# Patient Record
Sex: Male | Born: 1985 | Race: Black or African American | Hispanic: No | Marital: Married | Smoking: Never smoker
Health system: Southern US, Community
[De-identification: ages and names within clinical notes are randomized; demographics above are authoritative.]

## PROBLEM LIST (undated history)

## (undated) DIAGNOSIS — D721 Eosinophilia, unspecified: Secondary | ICD-10-CM

## (undated) DIAGNOSIS — B20 Human immunodeficiency virus [HIV] disease: Secondary | ICD-10-CM

## (undated) DIAGNOSIS — G43909 Migraine, unspecified, not intractable, without status migrainosus: Secondary | ICD-10-CM

## (undated) DIAGNOSIS — B019 Varicella without complication: Secondary | ICD-10-CM

## (undated) DIAGNOSIS — Z21 Asymptomatic human immunodeficiency virus [HIV] infection status: Secondary | ICD-10-CM

## (undated) DIAGNOSIS — D72819 Decreased white blood cell count, unspecified: Secondary | ICD-10-CM

## (undated) HISTORY — DX: Eosinophilia, unspecified: D72.10

## (undated) HISTORY — DX: Migraine, unspecified, not intractable, without status migrainosus: G43.909

## (undated) HISTORY — DX: Human immunodeficiency virus (HIV) disease: B20

## (undated) HISTORY — DX: Asymptomatic human immunodeficiency virus (hiv) infection status: Z21

## (undated) HISTORY — DX: Varicella without complication: B01.9

## (undated) HISTORY — DX: Decreased white blood cell count, unspecified: D72.819

---

## 2017-03-24 HISTORY — PX: TONSILLECTOMY: SHX5217

## 2017-12-20 ENCOUNTER — Ambulatory Visit: Payer: BLUE CROSS/BLUE SHIELD | Admitting: Physician Assistant

## 2017-12-20 ENCOUNTER — Other Ambulatory Visit: Payer: Self-pay

## 2017-12-20 ENCOUNTER — Encounter: Payer: Self-pay | Admitting: Physician Assistant

## 2017-12-20 VITALS — BP 122/76 | HR 79 | Temp 98.2°F | Resp 18 | Ht 72.05 in | Wt 195.2 lb

## 2017-12-20 DIAGNOSIS — R05 Cough: Secondary | ICD-10-CM

## 2017-12-20 DIAGNOSIS — J069 Acute upper respiratory infection, unspecified: Secondary | ICD-10-CM | POA: Diagnosis not present

## 2017-12-20 DIAGNOSIS — J029 Acute pharyngitis, unspecified: Secondary | ICD-10-CM

## 2017-12-20 DIAGNOSIS — J3489 Other specified disorders of nose and nasal sinuses: Secondary | ICD-10-CM | POA: Diagnosis not present

## 2017-12-20 DIAGNOSIS — R059 Cough, unspecified: Secondary | ICD-10-CM

## 2017-12-20 MED ORDER — IPRATROPIUM BROMIDE 0.03 % NA SOLN: 2.0000 | Freq: Two times a day (BID) | NASAL | 0 refills | Status: DC

## 2017-12-20 MED ORDER — PSEUDOEPHEDRINE HCL 60 MG PO TABS: 60.0000 mg | ORAL_TABLET | Freq: Two times a day (BID) | ORAL | 0 refills | Status: DC

## 2017-12-20 MED ORDER — HYDROCODONE-HOMATROPINE 5-1.5 MG/5ML PO SYRP: 5.0000 mL | ORAL_SOLUTION | Freq: Three times a day (TID) | ORAL | 0 refills | Status: DC | PRN

## 2017-12-20 MED ORDER — BENZONATATE 100 MG PO CAPS: 100.0000 mg | ORAL_CAPSULE | Freq: Three times a day (TID) | ORAL | 0 refills | Status: DC | PRN

## 2017-12-20 NOTE — Patient Instructions (Addendum)
- We will treat this as a respiratory viral infection.  - I recommend you rest, drink plenty of fluids, eat light meals including soups.  - You may use cough syrup at night for your cough and sore throat, Tessalon pearls during the day to stop the cough. If you want to bring up the mucus, take mucinex. Be aware that cough syrup can definitely make you drowsy and sleepy so do not drive or operate any heavy machinery if it is affecting you during the day.  - You may use sudafed for sinus pressure and nasal congestion. You can also use nasal spray for congestion. You can also use over the counter nasal saline rinses to help with nasal congestion.  - You may also use Tylenol or ibuprofen over-the-counter for your sore throat.  - Please let me know if you are not seeing any improvement or get worse in 5-7 days.     Upper Respiratory Infection, Adult Most upper respiratory infections (URIs) are caused by a virus. A URI affects the nose, throat, and upper air passages. The most common type of URI is often called "the common cold." Follow these instructions at home:  Take medicines only as told by your doctor.  Gargle warm saltwater or take cough drops to comfort your throat as told by your doctor.  Use a warm mist humidifier or inhale steam from a shower to increase air moisture. This may make it easier to breathe.  Drink enough fluid to keep your pee (urine) clear or pale yellow.  Eat soups and other clear broths.  Have a healthy diet.  Rest as needed.  Go back to work when your fever is gone or your doctor says it is okay. ? You may need to stay home longer to avoid giving your URI to others. ? You can also wear a face mask and wash your hands often to prevent spread of the virus.  Use your inhaler more if you have asthma.  Do not use any tobacco products, including cigarettes, chewing tobacco, or electronic cigarettes. If you need help quitting, ask your doctor. Contact a doctor  if:  You are getting worse, not better.  Your symptoms are not helped by medicine.  You have chills.  You are getting more short of breath.  You have brown or red mucus.  You have yellow or brown discharge from your nose.  You have pain in your face, especially when you bend forward.  You have a fever.  You have puffy (swollen) neck glands.  You have pain while swallowing.  You have white areas in the back of your throat. Get help right away if:  You have very bad or constant: ? Headache. ? Ear pain. ? Pain in your forehead, behind your eyes, and over your cheekbones (sinus pain). ? Chest pain.  You have long-lasting (chronic) lung disease and any of the following: ? Wheezing. ? Long-lasting cough. ? Coughing up blood. ? A change in your usual mucus.  You have a stiff neck.  You have changes in your: ? Vision. ? Hearing. ? Thinking. ? Mood. This information is not intended to replace advice given to you by your health care provider. Make sure you discuss any questions you have with your health care provider. Document Released: 03/28/2008 Document Revised: 06/12/2016 Document Reviewed: 01/15/2014 Elsevier Interactive Patient Education  2018 [employer].   IF you received an x-ray today, you will receive an invoice from [facility]. Please contact [facility] at  [phone] with questions or concerns regarding your invoice.   IF you received labwork today, you will receive an invoice from [facility]. Please contact [facility] at [phone] with questions or concerns regarding your invoice.   Our billing staff will not be able to assist you with questions regarding bills from these companies.  You will be contacted with the lab results as soon as they are available. The fastest way to get your results is to activate your My Chart account. Instructions are located on the last page of this paperwork. If you have not heard from us regarding the  results in 2 weeks, please contact this office.

## 2017-12-20 NOTE — Progress Notes (Signed)
MRN: [id number] DOB: 04-07-1986  Subjective:   Joel Wright is a 32 y.o. male presenting for chief complaint of Cough (X 3 days- pt states with yellow mucus) .  Reports 3 day history of productive cough, nasal congestion, sinus pressure,  and sore throat. Symptoms are improving. Has tried mucinex with some relief. Denies fever, ear pain, wheezing, shortness of breath and chest pain, nausea, vomiting, abdominal pain and diarrhea. Has not had sick contact with anyone. No history of seasonal allergies, no history of asthma. Patient has not flu shot this season.Denies Denies any other aggravating or relieving factors, no other questions or concerns.  [name] currently has no medications in their medication list. Also has No Known Allergies.  [name]  has no past medical history on file. Also  has a past surgical history that includes Tonsillectomy (03/2017).   Objective:   Vitals: BP 122/76 (BP Location: Left Arm, Patient Position: Sitting, Cuff Size: Normal)   Pulse 79   Temp 98.2 F (36.8 C) (Oral)   Resp 18   Ht 6' 0.05" (1.83 m)   Wt 195 lb 3.2 oz (88.5 kg)   SpO2 97%   BMI 26.44 kg/m   Physical Exam  Constitutional: He is oriented to person, place, and time. He appears well-developed and well-nourished.  HENT:  Head: Normocephalic and atraumatic.  Right Ear: Tympanic membrane, external ear and ear canal normal.  Left Ear: Tympanic membrane, external ear and ear canal normal.  Nose: Mucosal edema and rhinorrhea present. Right sinus exhibits maxillary sinus tenderness (mild). Right sinus exhibits no frontal sinus tenderness. Left sinus exhibits maxillary sinus tenderness (mild). Left sinus exhibits no frontal sinus tenderness.  Mouth/Throat: Uvula is midline, oropharynx is clear and moist and mucous membranes are normal. Tonsils are 1+ on the right. Tonsils are 1+ on the left. No tonsillar exudate.  Eyes: Conjunctivae are normal. Pupils are equal, round, and reactive to light.    Neck: Normal range of motion.  Cardiovascular: Normal rate, regular rhythm and normal heart sounds.  Pulmonary/Chest: Effort normal. He has no decreased breath sounds. He has no wheezes. He has no rhonchi. He has no rales.  Lymphadenopathy:       Head (right side): No submental, no submandibular, no tonsillar, no preauricular, no posterior auricular and no occipital adenopathy present.       Head (left side): No submental, no submandibular, no tonsillar, no preauricular, no posterior auricular and no occipital adenopathy present.    He has cervical adenopathy.       Right cervical: Posterior cervical adenopathy present. No superficial cervical and no deep cervical adenopathy present.      Left cervical: Posterior cervical adenopathy present. No superficial cervical and no deep cervical adenopathy present.       Right: No supraclavicular adenopathy present.       Left: No supraclavicular adenopathy present.  Neurological: He is alert and oriented to person, place, and time.  Skin: Skin is warm and dry.  Psychiatric: He has a normal mood and affect.  Vitals reviewed.   No results found for this or any previous visit (from the past 24 hour(s)).  Assessment and Plan :  1. Sinus pressure - pseudoephedrine (SUDAFED) 60 MG tablet; Take 1 tablet (60 mg total) by mouth every 12 (twelve) hours.  Dispense: 30 tablet; Refill: 0 2. Sore throat 3. Rhinorrhea - ipratropium (ATROVENT) 0.03 % nasal spray; Place 2 sprays into both nostrils 2 (two) times daily.  Dispense: 30 mL;  Refill: 0 4. Cough - benzonatate (TESSALON) 100 MG capsule; Take 1-2 capsules (100-200 mg total) by mouth 3 (three) times daily as needed for cough.  Dispense: 40 capsule; Refill: 0 - HYDROcodone-homatropine (HYCODAN) 5-1.5 MG/5ML syrup; Take 5 mLs by mouth every 8 (eight) hours as needed for cough.  Dispense: 120 mL; Refill: 0 5. Acute upper respiratory infection Hx and PE findings consistent with viral URI.  Recommend  symptomatic treatment at this time.  Patient is a well-appearing, no distress. Vitals stable.  Lungs CTAB. Advised to return to clinic if symptoms worsen, do not improve in 5-7 days, or as needed.   Benjiman Core, PA-C  Primary Care at [facility] 12/20/2017 1:19 PM

## 2017-12-21 ENCOUNTER — Other Ambulatory Visit: Payer: Self-pay

## 2017-12-21 ENCOUNTER — Other Ambulatory Visit: Payer: Self-pay | Admitting: Physician Assistant

## 2017-12-21 DIAGNOSIS — R05 Cough: Secondary | ICD-10-CM

## 2017-12-21 DIAGNOSIS — R059 Cough, unspecified: Secondary | ICD-10-CM

## 2017-12-21 MED ORDER — HYDROCODONE-HOMATROPINE 5-1.5 MG/5ML PO SYRP: 5.0000 mL | ORAL_SOLUTION | Freq: Three times a day (TID) | ORAL | 0 refills | Status: DC | PRN

## 2017-12-21 NOTE — Telephone Encounter (Signed)
Copied from CRM [phone]. Topic: Quick Communication - Rx Refill/Question >> Dec 21, 2017  4:00 PM [name], [name], [name] wrote: Medication: hycodan syrup 5-1.5 mg/ml   Has the patient contacted their pharmacy? yes   (Agent: If no, request that the patient contact the pharmacy for the refill.)   Preferred Pharmacy (with phone number [facility] [address] [facility] [address] [facility] did not [facility] it yesterday and this on does   Agent: Please be advised that RX refills may take up to 3 business days. We ask that you follow-up with your pharmacy.   Original pharmacy did not have medication

## 2017-12-22 ENCOUNTER — Other Ambulatory Visit: Payer: Self-pay | Admitting: Physician Assistant

## 2017-12-22 DIAGNOSIS — R05 Cough: Secondary | ICD-10-CM

## 2017-12-22 DIAGNOSIS — R059 Cough, unspecified: Secondary | ICD-10-CM

## 2017-12-22 MED ORDER — HYDROCODONE-HOMATROPINE 5-1.5 MG/5ML PO SYRP
5.0000 mL | ORAL_SOLUTION | Freq: Three times a day (TID) | ORAL | 0 refills | Status: DC | PRN
Start: 2017-12-22 — End: 2018-01-12

## 2017-12-22 NOTE — Telephone Encounter (Signed)
Copied from CRM [id number]. Topic: Quick Communication - Rx Refill/Question >> Dec 21, 2017  4:00 PM [name], Tashia, [name] wrote: Medication: hycodan syrup 5-1.5 mg/ml   Has the patient contacted their pharmacy? yes   (Agent: If no, request that the patient contact the pharmacy for the refill.)   Preferred Pharmacy (with phone number [facility] [address] [facility] [address] [facility] did not have it yesterday and this on does   Agent: Please be advised that RX refills may take up to 3 business days. We ask that you follow-up with your pharmacy. >> Dec 22, 2017 11:53 AM Raquel SarnaHayes, Teresa G wrote: Pt is now [facility] the [facility] [address] [place].  The original order was to [facility] filled at [facility] [address] They were out at the time of the Rx request.  The pt was seen by [place]Brittany Wiseman on Wed and hasn't had his Rx filled and he is still sick needing this called in today if possible.

## 2017-12-22 NOTE — Telephone Encounter (Signed)
Spoke to pt who clarified that initial Rx was electronically sent but pharmacy did not have medication.  (Confirmed with pharmacy that Rx has not been filled.)  CMA was resending but appears that Rx was printed instead.  Unable to find paper copy but Rx was not signed out by patient.  Spoke to CitigroupWhitney McVey, [place], who is Aeronautical engineerresubmitting electronic Rx as previous PA is not here.  Pt advised.

## 2017-12-22 NOTE — Telephone Encounter (Signed)
Spoke to clinical in the office and verified that rx was printed.  [facility]

## 2017-12-22 NOTE — Telephone Encounter (Signed)
Pt calling back and requesting for rx for Hycodan syrup 5/1.5mg /ml [facility] [address] [address] on 2/28.

## 2017-12-22 NOTE — Telephone Encounter (Signed)
Pt received call, no documentation - pt is asking if he can pick up the rx that was printed?  He is needing the medication soon.   Please call [phone]

## 2017-12-27 ENCOUNTER — Ambulatory Visit: Payer: BLUE CROSS/BLUE SHIELD | Admitting: Urgent Care

## 2017-12-27 ENCOUNTER — Encounter: Payer: Self-pay | Admitting: Urgent Care

## 2017-12-27 VITALS — BP 131/83 | HR 99 | Temp 98.6°F | Resp 16 | Ht 72.05 in | Wt 195.0 lb

## 2017-12-27 DIAGNOSIS — R05 Cough: Secondary | ICD-10-CM

## 2017-12-27 DIAGNOSIS — J3489 Other specified disorders of nose and nasal sinuses: Secondary | ICD-10-CM | POA: Diagnosis not present

## 2017-12-27 DIAGNOSIS — J069 Acute upper respiratory infection, unspecified: Secondary | ICD-10-CM | POA: Diagnosis not present

## 2017-12-27 DIAGNOSIS — R0982 Postnasal drip: Secondary | ICD-10-CM

## 2017-12-27 DIAGNOSIS — J029 Acute pharyngitis, unspecified: Secondary | ICD-10-CM | POA: Diagnosis not present

## 2017-12-27 DIAGNOSIS — R059 Cough, unspecified: Secondary | ICD-10-CM

## 2017-12-27 MED ORDER — AMOXICILLIN 500 MG PO CAPS: 500.0000 mg | ORAL_CAPSULE | Freq: Three times a day (TID) | ORAL | 0 refills | Status: DC

## 2017-12-27 MED ORDER — PSEUDOEPHEDRINE HCL ER 120 MG PO TB12: 120.0000 mg | ORAL_TABLET | Freq: Two times a day (BID) | ORAL | 3 refills | Status: DC

## 2017-12-27 NOTE — Progress Notes (Signed)
    MRN: [id number] DOB: [birth year]  Subjective:   Joel Wright is a 32 y.o. male presenting for follow up on URI. Patient was last seen at our practice by another provider on 12/20/2017, started on supportive care including Sudafed, Atrovent, Tessalon and Hycodan. Today, patient reports ongoing cough, worse during the day. Hycodan helps at night, Tessalon did not work. He did not pick up Sudafed. Still has sneezing, sinus pain, intermittent sore throat. Denies fever, ear pain, chest pain, shob, wheezing, n/v, abdominal pain. Denies smoking cigarettes. Denies history of allergies, asthma.   [name] has a current medication list which includes the following prescription(s): benzonatate, hydrocodone-homatropine, ipratropium, and pseudoephedrine. Also is allergic to other.  Denies past medical history. Also  has a past surgical history that includes Tonsillectomy (03/2017).  Objective:   Vitals: BP 131/83   Pulse 99   Temp 98.6 F (37 C) (Oral)   Resp 16   Ht 6' 0.05" (1.83 m)   Wt 195 lb (88.5 kg)   SpO2 98%   BMI 26.41 kg/m   Physical Exam  Constitutional: He is oriented to person, place, and time. He appears well-developed and well-nourished.  HENT:  TM's intact and normal. No sinus tenderness. Significant post-nasal drainage.  Eyes: Right eye exhibits no discharge. Left eye exhibits no discharge.  Neck: Normal range of motion. Neck supple.  Cardiovascular: Normal rate, regular rhythm and intact distal pulses. Exam reveals no gallop and no friction rub.  No murmur heard. Pulmonary/Chest: No respiratory distress. He has no wheezes. He has no rales.  Lymphadenopathy:    He has no cervical adenopathy.  Neurological: He is alert and oriented to person, place, and time.  Skin: Skin is warm and dry.  Psychiatric: He has a normal mood and affect.   Assessment and Plan :   Cough  Sore throat  Sinus pain  Acute upper respiratory infection  Post-nasal drainage  Patient will  try Sudafed, hydration, honey based tea. Offered script for amoxicillin to address sinusitis if symptoms persist. Return-to-clinic precautions discussed, patient verbalized understanding.   Wallis BambergMario Anapaola Kinsel, PA-C [facility] [phone] 12/27/2017 9:17 AM

## 2017-12-27 NOTE — Patient Instructions (Addendum)
Hydrate well with at least 2 liters (1 gallon) of water daily. For sore throat try using a honey-based tea. Use 3 teaspoons of honey with juice squeezed from half lemon. Place shaved pieces of ginger into 1/2-1 cup of water and warm over stove top. Then mix the ingredients and repeat every 4 hours as needed. Use Sudafed 120mg  twice daily as needed for post-nasal drainage. Wait ~3-4 days and if there is no improvement, fill the script for amoxicillin.    Sinusitis, Adult Sinusitis is soreness and inflammation of your sinuses. Sinuses are hollow spaces in the bones around your face. They are located:  Around your eyes.  In the middle of your forehead.  Behind your nose.  In your cheekbones.  Your sinuses and nasal passages are lined with a stringy fluid (mucus). Mucus normally drains out of your sinuses. When your nasal tissues get inflamed or swollen, the mucus can get trapped or blocked so air cannot flow through your sinuses. This lets bacteria, viruses, and funguses grow, and that leads to infection. Follow these instructions at home: Medicines  Take, use, or apply over-the-counter and prescription medicines only as told by your doctor. These may include nasal sprays.  If you were prescribed an antibiotic medicine, take it as told by your doctor. Do not stop taking the antibiotic even if you start to feel better. Hydrate and Humidify  Drink enough water to keep your pee (urine) clear or pale yellow.  Use a cool mist humidifier to keep the humidity level in your home above 50%.  Breathe in steam for 10-15 minutes, 3-4 times a day or as told by your doctor. You can do this in the bathroom while a hot shower is running.  Try not to spend time in cool or dry air. Rest  Rest as much as possible.  Sleep with your head raised (elevated).  Make sure to get enough sleep each night. General instructions  Put a warm, moist washcloth on your face 3-4 times a day or as told by your  doctor. This will help with discomfort.  Wash your hands often with soap and water. If there is no soap and water, use hand sanitizer.  Do not smoke. Avoid being around people who are smoking (secondhand smoke).  Keep all follow-up visits as told by your doctor. This is important. Contact a doctor if:  You have a fever.  Your symptoms get worse.  Your symptoms do not get better within 10 days. Get help right away if:  You have a very bad headache.  You cannot stop throwing up (vomiting).  You have pain or swelling around your face or eyes.  You have trouble seeing.  You feel confused.  Your neck is stiff.  You have trouble breathing. This information is not intended to replace advice given to you by your health care provider. Make sure you discuss any questions you have with your health care provider. Document Released: 03/28/2008 Document Revised: 06/05/2016 Document Reviewed: 08/05/2015 Elsevier Interactive Patient Education  2018 [employer]    IF you received an x-ray today, you will receive an invoice from [facility]. Please contact [facility] at [phone] with questions or concerns regarding your invoice.   IF you received labwork today, you will receive an invoice from [facility]. Please contact [facility] at [phone] with questions or concerns regarding your invoice.   Our billing staff will not be able to assist you with questions regarding bills from these companies.  You will be  contacted with the lab results as soon as they are available. The fastest way to get your results is to activate your My Chart account. Instructions are located on the last page of this paperwork. If you have not heard from [place] regarding the results in 2 weeks, please contact this office.

## 2018-01-11 ENCOUNTER — Ambulatory Visit: Payer: BLUE CROSS/BLUE SHIELD | Admitting: Adult Health

## 2018-01-12 ENCOUNTER — Encounter: Payer: Self-pay | Admitting: Adult Health

## 2018-01-12 ENCOUNTER — Ambulatory Visit: Payer: BLUE CROSS/BLUE SHIELD | Admitting: Adult Health

## 2018-01-12 VITALS — BP 116/72 | Temp 98.3°F | Wt 195.0 lb

## 2018-01-12 DIAGNOSIS — J0110 Acute frontal sinusitis, unspecified: Secondary | ICD-10-CM | POA: Diagnosis not present

## 2018-01-12 DIAGNOSIS — Z23 Encounter for immunization: Secondary | ICD-10-CM | POA: Diagnosis not present

## 2018-01-12 DIAGNOSIS — Z0000 Encounter for general adult medical examination without abnormal findings: Secondary | ICD-10-CM | POA: Diagnosis not present

## 2018-01-12 MED ORDER — FLUTICASONE PROPIONATE 50 MCG/ACT NA SUSP: 2.0000 | Freq: Every day | NASAL | 6 refills | Status: DC

## 2018-01-12 MED ORDER — DOXYCYCLINE HYCLATE 100 MG PO CAPS: 100.0000 mg | ORAL_CAPSULE | Freq: Two times a day (BID) | ORAL | 0 refills | Status: DC

## 2018-01-12 NOTE — Progress Notes (Signed)
Patient presents to clinic today to establish care. She is a pleasant 32 year old male who  has a past medical history of Chicken pox and Migraines.   Acute Concerns: Establish Care/CPE   Sinusitis - was treated with Amoxillin in early March. His symptoms resolved and then returned a week later. Complains of productive cough, sinus pain and pressure and headaches. Denies fevers, chills, or feeling ill.   Chronic Issues: Migraines - Only gets them when he chews gum.   Health Maintenance: Dental -- Does not do routine care Vision -- Routine Care  Immunizations - Unknown if Colonoscopy -- Never had   Past Medical History:  Diagnosis Date  . Chicken pox   . Migraines     Past Surgical History:  Procedure Laterality Date  . TONSILLECTOMY  03/2017    Current Outpatient Medications on File Prior to Visit  Medication Sig Dispense Refill  . pseudoephedrine (SUDAFED 12 HOUR) 120 MG 12 hr tablet Take 1 tablet (120 mg total) by mouth 2 (two) times daily. 30 tablet 3   No current facility-administered medications on file prior to visit.     Allergies  Allergen Reactions  . Other Hives    Oranges    Family History  Problem Relation Age of Onset  . Hypertension Mother   . Breast cancer Mother   . Diabetes Mellitus II Mother   . Hyperlipidemia Father        2013  . Heart attack Father   . Colon cancer Maternal Grandmother   . Heart attack Paternal Grandmother   . Esophageal cancer Paternal Grandfather     Social History   Socioeconomic History  . Marital status: Married    Spouse name: [name]  . Number of children: 2  . Years of education: Not on file  . Highest education level: Not on file  Occupational History  . Not on file  Social Needs  . Financial resource strain: Not on file  . Food insecurity:    Worry: Not on file    Inability: Not on file  . Transportation needs:    Medical: Not on file    Non-medical: Not on file  Tobacco Use  . Smoking  status: Never Smoker  . Smokeless tobacco: Never Used  Substance and Sexual Activity  . Alcohol use: Yes    Frequency: Never    Comment: occ  . Drug use: No  . Sexual activity: Yes  Lifestyle  . Physical activity:    Days per week: Not on file    Minutes per session: Not on file  . Stress: Not on file  Relationships  . Social connections:    Talks on phone: Not on file    Gets together: Not on file    Attends religious service: Not on file    Active member of club or organization: Not on file    Attends meetings of clubs or organizations: Not on file    Relationship status: Not on file  . Intimate partner violence:    Fear of current or ex partner: Not on file    Emotionally abused: Not on file    Physically abused: Not on file    Forced sexual activity: Not on file  Other Topics Concern  . Not on file  Social History Narrative  . Not on file    Review of Systems  Constitutional: Negative.   HENT: Positive for congestion and sinus pain. Negative for ear discharge, ear pain and  sore throat.   Eyes: Negative.   Respiratory: Negative.   Cardiovascular: Negative.   Gastrointestinal: Negative.   Genitourinary: Negative.   Musculoskeletal: Negative.   Skin: Negative.   Neurological: Negative.   Endo/Heme/Allergies: Negative.   Psychiatric/Behavioral: Negative.     BP 116/72 (BP Location: Left Arm)   Temp 98.3 F (36.8 C) (Oral)   Wt 195 lb (88.5 kg)   BMI 26.41 kg/m   Physical Exam  Constitutional: He is oriented to person, place, and time and well-developed, well-nourished, and in no distress. No distress.  HENT:  Head: Normocephalic and atraumatic.  Right Ear: Hearing, tympanic membrane, external ear and ear canal normal.  Left Ear: Hearing, tympanic membrane, external ear and ear canal normal.  Nose: Mucosal edema present. Right sinus exhibits frontal sinus tenderness. Right sinus exhibits no maxillary sinus tenderness. Left sinus exhibits frontal sinus  tenderness. Left sinus exhibits no maxillary sinus tenderness.  Mouth/Throat: Uvula is midline, oropharynx is clear and moist and mucous membranes are normal. No oropharyngeal exudate.  Eyes: Pupils are equal, round, and reactive to light. Conjunctivae and EOM are normal. Right eye exhibits no discharge. Left eye exhibits no discharge. No scleral icterus.  Neck: Normal range of motion. Neck supple. No JVD present. No tracheal deviation present. No thyromegaly present.  Cardiovascular: Normal rate, regular rhythm, normal heart sounds and intact distal pulses. Exam reveals no gallop and no friction rub.  No murmur heard. Pulmonary/Chest: Effort normal and breath sounds normal. No stridor. No respiratory distress. He has no wheezes. He has no rales. He exhibits no tenderness.  Abdominal: Soft. Bowel sounds are normal. He exhibits no distension and no mass. There is no tenderness. There is no rebound and no guarding.  Musculoskeletal: Normal range of motion. He exhibits no edema, tenderness or deformity.  Lymphadenopathy:    He has no cervical adenopathy.  Neurological: He is alert and oriented to person, place, and time. He displays normal reflexes. No cranial nerve deficit. He exhibits normal muscle tone. Gait normal. Coordination normal. GCS score is 15.  Skin: Skin is warm and dry. No rash noted. He is not diaphoretic. No erythema. No pallor.  Psychiatric: Mood, memory, affect and judgment normal.  Nursing note and vitals reviewed.  Assessment/Plan: 1. Routine general medical examination at a health care facility  - Basic metabolic panel; Future - CBC with Differential/Platelet; Future - Hepatic function panel; Future - Lipid panel; Future - TSH; Future  2. Acute non-recurrent frontal sinusitis  - doxycycline (VIBRAMYCIN) 100 MG capsule; Take 1 capsule (100 mg total) by mouth 2 (two) times daily.  Dispense: 14 capsule; Refill: 0 - fluticasone (FLONASE) 50 MCG/ACT nasal spray; Place 2  sprays into both nostrils daily.  Dispense: 16 g; Refill: 6  3. Need for tetanus booster  - Td : Tetanus/diphtheria >7yo Preservative  free   Shirline Freesory Faustina Gebert, NP

## 2018-01-12 NOTE — Patient Instructions (Signed)
It was great meeting you today   Please follow up for labs and I will call you to let you know what they show.   I have sent in doxycycline and Flonase for your sinus infection.   Please let me know if you need anything

## 2018-01-29 ENCOUNTER — Ambulatory Visit: Payer: BLUE CROSS/BLUE SHIELD | Admitting: Family Medicine

## 2018-01-29 ENCOUNTER — Encounter: Payer: Self-pay | Admitting: Family Medicine

## 2018-01-29 ENCOUNTER — Ambulatory Visit (INDEPENDENT_AMBULATORY_CARE_PROVIDER_SITE_OTHER)
Admission: RE | Admit: 2018-01-29 | Discharge: 2018-01-29 | Disposition: A | Discharge status: Home / Self Care | Payer: BLUE CROSS/BLUE SHIELD | Source: Ambulatory Visit | Attending: Family Medicine | Admitting: Family Medicine

## 2018-01-29 VITALS — BP 122/82 | HR 94 | Temp 98.3°F | Wt 194.0 lb

## 2018-01-29 DIAGNOSIS — R05 Cough: Secondary | ICD-10-CM

## 2018-01-29 DIAGNOSIS — R059 Cough, unspecified: Secondary | ICD-10-CM

## 2018-01-29 DIAGNOSIS — H6690 Otitis media, unspecified, unspecified ear: Secondary | ICD-10-CM

## 2018-01-29 MED ORDER — AMOXICILLIN-POT CLAVULANATE 500-125 MG PO TABS: 1.0000 | ORAL_TABLET | Freq: Two times a day (BID) | ORAL | 0 refills | Status: AC

## 2018-01-29 NOTE — Progress Notes (Signed)
Subjective:    Patient ID: Joel Wright, male    DOB: Jan 22, 1986, 32 y.o.   MRN: [id number]  Chief Complaint  Patient presents with  . Cough    HPI Patient was seen today for ongoing symptoms.  Pt endorses sore throat, productive cough x 1 month, now with R ear pain x 1 day.  Pt also endorses occasional HA with coughing.  Pt seen on 2/27, 3/6/ and 3/22 for similar symptoms.  Pt has tried OTC allergy meds, amoxicillin, and doxycycline with no relief.  Pt denies fever, current facial pressure, or sick contacts.  Pt was using Flonase nasal spray, but has not been in the last few days.  Past Medical History:  Diagnosis Date  . Chicken pox   . Migraines     Allergies  Allergen Reactions  . Other Hives    Oranges    ROS General: Denies fever, chills, night sweats, changes in weight, changes in appetite HEENT: Denies headaches, changes in vision, rhinorrhea   +sore throat, R ear pain CV: Denies CP, palpitations, SOB, orthopnea Pulm: Denies SOB, wheezing   +cough GI: Denies abdominal pain, nausea, vomiting, diarrhea, constipation GU: Denies dysuria, hematuria, frequency, vaginal discharge Msk: Denies muscle cramps, joint pains Neuro: Denies weakness, numbness, tingling Skin: Denies rashes, bruising Psych: Denies depression, anxiety, hallucinations     Objective:    Blood pressure 122/82, pulse 94, temperature 98.3 F (36.8 C), temperature source Oral, weight 194 lb (88 kg), SpO2 98 %.   Gen. Pleasant, well-nourished, in no distress, normal affect  HEENT: [place]/AT, face symmetric, no scleral icterus, PERRLA,  nares patent without drainage, pharynx without erythema or exudate. R TMs full with moderate to severe erythema.  L TM normal.  Neck: No JVD, no thyromegaly Lungs: no accessory muscle use, CTAB, no wheezes or rales Cardiovascular: RRR, no m/r/g, no peripheral edema Abdomen: BS present, soft, NT/ND Neuro:  A&Ox3, CN II-XII intact, normal gait   Wt Readings from Last 3  Encounters:  01/29/18 194 lb (88 kg)  01/12/18 195 lb (88.5 kg)  12/27/17 195 lb (88.5 kg)    No results found for: WBC, HGB, HCT, PLT, GLUCOSE, CHOL, TRIG, HDL, LDLDIRECT, LDLCALC, ALT, AST, NA, K, CL, CREATININE, BUN, CO2, TSH, PSA, INR, GLUF, HGBA1C, MICROALBUR  Assessment/Plan:  Acute otitis media, unspecified otitis media type -Given handout - Plan: amoxicillin-clavulanate (AUGMENTIN) 500-125 MG tablet -Given RTC precautions including fever, chills, worsening of symptoms.  Cough -We will obtain CXR given prolonged history of cough. -DDX includes bronchitis, pneumonia, postnasal drainage -Patient encouraged to continue Flonase - Plan: DG Chest 2 View--- patient called and made aware of negative chest x-ray  Follow-up PRN in the next few days  Abbe AmsterdamShannon Errick Salts, MD

## 2018-01-29 NOTE — Patient Instructions (Addendum)
It is ok to continue taking your allergy medications including Flonase and Zyrtec.  Otitis Media, Adult Otitis media occurs when there is inflammation and fluid in the middle ear. Your middle ear is a part of the ear that contains bones for hearing as well as air that helps send sounds to your brain. What are the causes? This condition is caused by a blockage in the eustachian tube. This tube drains fluid from the ear to the back of the nose (nasopharynx). A blockage in this tube can be caused by an object or by swelling (edema) in the tube. Problems that can cause a blockage include:  A cold or other upper respiratory infection.  Allergies.  An irritant, such as tobacco smoke.  Enlarged adenoids. The adenoids are areas of soft tissue located high in the back of the throat, behind the nose and the roof of the mouth.  A mass in the nasopharynx.  Damage to the ear caused by pressure changes (barotrauma).  What are the signs or symptoms? Symptoms of this condition include:  Ear pain.  A fever.  Decreased hearing.  A headache.  Tiredness (lethargy).  Fluid leaking from the ear.  Ringing in the ear.  How is this diagnosed? This condition is diagnosed with a physical exam. During the exam your health care provider will use an instrument called an otoscope to look into your ear and check for redness, swelling, and fluid. He or she will also ask about your symptoms. Your health care provider may also order tests, such as:  A test to check the movement of the eardrum (pneumatic otoscopy). This test is done by squeezing a small amount of air into the ear.  A test that changes air pressure in the middle ear to check how well the eardrum moves and whether the eustachian tube is working (tympanogram).  How is this treated? This condition usually goes away on its own within 3-5 days. But if the condition is caused by a bacteria infection and does not go away own its own, or keeps  coming back, your health care provider may:  Prescribe antibiotic medicines to treat the infection.  Prescribe or recommend medicines to control pain.  Follow these instructions at home:  Take over-the-counter and prescription medicines only as told by your health care provider.  If you were prescribed an antibiotic medicine, take it as told by your health care provider. Do not stop taking the antibiotic even if you start to feel better.  Keep all follow-up visits as told by your health care provider. This is important. Contact a health care provider if:  You have bleeding from your nose.  There is a lump on your neck.  You are not getting better in 5 days.  You feel worse instead of better. Get help right away if:  You have severe pain that is not controlled with medicine.  You have swelling, redness, or pain around your ear.  You have stiffness in your neck.  A part of your face is paralyzed.  The bone behind your ear (mastoid) is tender when you touch it.  You develop a severe headache. Summary  Otitis media is redness, soreness, and swelling of the middle ear.  This condition usually goes away on its own within 3-5 days.  If the problem does not go away in 3-5 days, your health care provider may prescribe or recommend medicines to treat your symptoms.  If you were prescribed an antibiotic medicine, take it as told  by your health care provider. This information is not intended to replace advice given to you by your health care provider. Make sure you discuss any questions you have with your health care provider. Document Released: 07/15/2004 Document Revised: 09/30/2016 Document Reviewed: 09/30/2016 Elsevier Interactive Patient Education  2018 Else[employer] Inc[employer]  Cough, Adult Coughing is a reflex that clears your throat and your airways. Coughing helps to heal and protect your lungs. It is normal to cough occasionally, but a cough that happens with other symptoms or  lasts a long time may be a sign of a condition that needs treatment. A cough may last only 2-3 weeks (acute), or it may last longer than 8 weeks (chronic). What are the causes? Coughing is commonly caused by:  Breathing in substances that irritate your lungs.  A viral or bacterial respiratory infection.  Allergies.  Asthma.  Postnasal drip.  Smoking.  Acid backing up from the stomach into the esophagus (gastroesophageal reflux).  Certain medicines.  Chronic lung problems, including COPD (or rarely, lung cancer).  Other medical conditions such as heart failure.  Follow these instructions at home: Pay attention to any changes in your symptoms. Take these actions to help with your discomfort:  Take medicines only as told by your health care provider. ? If you were prescribed an antibiotic medicine, take it as told by your health care provider. Do not stop taking the antibiotic even if you start to feel better. ? Talk with your health care provider before you take a cough suppressant medicine.  Drink enough fluid to keep your urine clear or pale yellow.  If the air is dry, use a cold steam vaporizer or humidifier in your bedroom or your home to help loosen secretions.  Avoid anything that causes you to cough at work or at home.  If your cough is worse at night, try sleeping in a semi-upright position.  Avoid cigarette smoke. If you smoke, quit smoking. If you need help quitting, ask your health care provider.  Avoid caffeine.  Avoid alcohol.  Rest as needed.  Contact a health care provider if:  You have new symptoms.  You cough up pus.  Your cough does not get better after 2-3 weeks, or your cough gets worse.  You cannot control your cough with suppressant medicines and you are losing sleep.  You develop pain that is getting worse or pain that is not controlled with pain medicines.  You have a fever.  You have unexplained weight loss.  You have night  sweats. Get help right away if:  You cough up blood.  You have difficulty breathing.  Your heartbeat is very fast. This information is not intended to replace advice given to you by your health care provider. Make sure you discuss any questions you have with your health care provider. Document Released: 04/08/2011 Document Revised: 03/17/2016 Document Reviewed: 12/17/2014 Elsevier Interactive Patient Education  [employer]2018 Else[employer] Inc.

## 2018-02-05 DIAGNOSIS — Z9089 Acquired absence of other organs: Secondary | ICD-10-CM | POA: Diagnosis not present

## 2018-02-05 DIAGNOSIS — R490 Dysphonia: Secondary | ICD-10-CM | POA: Diagnosis not present

## 2018-02-05 DIAGNOSIS — J029 Acute pharyngitis, unspecified: Secondary | ICD-10-CM | POA: Diagnosis not present

## 2018-02-19 DIAGNOSIS — R05 Cough: Secondary | ICD-10-CM | POA: Diagnosis not present

## 2018-02-19 DIAGNOSIS — R490 Dysphonia: Secondary | ICD-10-CM | POA: Diagnosis not present

## 2018-03-30 DIAGNOSIS — H10413 Chronic giant papillary conjunctivitis, bilateral: Secondary | ICD-10-CM | POA: Diagnosis not present

## 2018-04-18 DIAGNOSIS — D234 Other benign neoplasm of skin of scalp and neck: Secondary | ICD-10-CM | POA: Diagnosis not present

## 2018-05-14 ENCOUNTER — Encounter (HOSPITAL_BASED_OUTPATIENT_CLINIC_OR_DEPARTMENT_OTHER): Payer: Self-pay

## 2018-05-14 ENCOUNTER — Ambulatory Visit (HOSPITAL_BASED_OUTPATIENT_CLINIC_OR_DEPARTMENT_OTHER): Admit: 2018-05-14 | Payer: BLUE CROSS/BLUE SHIELD | Admitting: Specialist

## 2018-05-14 SURGERY — EXCISION MASS
Anesthesia: General

## 2018-05-22 ENCOUNTER — Encounter: Payer: Self-pay | Admitting: Adult Health

## 2018-05-22 ENCOUNTER — Ambulatory Visit: Payer: BLUE CROSS/BLUE SHIELD | Admitting: Adult Health

## 2018-05-22 VITALS — BP 136/90 | Temp 97.9°F | Wt 194.0 lb

## 2018-05-22 DIAGNOSIS — R197 Diarrhea, unspecified: Secondary | ICD-10-CM | POA: Diagnosis not present

## 2018-05-22 MED ORDER — CIPROFLOXACIN HCL 500 MG PO TABS: 500.0000 mg | ORAL_TABLET | Freq: Two times a day (BID) | ORAL | 0 refills | Status: DC

## 2018-05-22 NOTE — Progress Notes (Signed)
Subjective:    Patient ID: Joel Wright, male    DOB: [birth year], 32 y.o.   MRN: [id number]  HPI  32 year old male who  has a past medical history of Chicken pox and Migraines.  He presents to the office today for the acute complaint of diarrhea.  He reports having diarrhea 3-4 times a day x3 weeks.  Denies any blood in his stool or abnormal odor.  Diarrhea is described as "dark".  Denies any recent antibiotic use, did travel to [place] and may have eaten some questionable seafood right before diarrhea started.  Denies any fevers, chills, nausea, or vomiting.  Does endorse cramping during bowel movements.  He does not endorse feeling acutely ill  He has tried Pepto and Imodium resolution of his symptoms.  Review of Systems See HPI   Past Medical History:  Diagnosis Date  . Chicken pox   . Migraines     Social History   Socioeconomic History  . Marital status: Married    Spouse name: Eliezer LoftsJessyca  . Number of children: 2  . Years of education: Not on file  . Highest education level: Not on file  Occupational History  . Not on file  Social Needs  . Financial resource strain: Not on file  . Food insecurity:    Worry: Not on file    Inability: Not on file  . Transportation needs:    Medical: Not on file    Non-medical: Not on file  Tobacco Use  . Smoking status: Never Smoker  . Smokeless tobacco: Never Used  Substance and Sexual Activity  . Alcohol use: Yes    Frequency: Never    Comment: occ  . Drug use: No  . Sexual activity: Yes  Lifestyle  . Physical activity:    Days per week: Not on file    Minutes per session: Not on file  . Stress: Not on file  Relationships  . Social connections:    Talks on phone: Not on file    Gets together: Not on file    Attends religious service: Not on file    Active member of club or organization: Not on file    Attends meetings of clubs or organizations: Not on file    Relationship status: Not on file  . Intimate partner  violence:    Fear of current or ex partner: Not on file    Emotionally abused: Not on file    Physically abused: Not on file    Forced sexual activity: Not on file  Other Topics Concern  . Not on file  Social History Narrative   [employer]ensing [employer]    Likes to watch football       Married    Two children 5&2       Past Surgical History:  Procedure Laterality Date  . TONSILLECTOMY  03/2017    Family History  Problem Relation Age of Onset  . Hypertension Mother   . Breast cancer Mother   . Diabetes Mellitus II Mother   . Hyperlipidemia Father        2013  . Heart attack Father   . Colon cancer Maternal Grandmother        Diagnosed in 1994   . Heart attack Paternal Grandmother   . Esophageal cancer Paternal Grandfather     Allergies  Allergen Reactions  . Other Hives    Oranges    Current Outpatient Medications on File Prior to Visit  Medication Sig Dispense Refill  . fluticasone (FLONASE) 50 MCG/ACT nasal spray Place 2 sprays into both nostrils daily. 16 g 6   No current facility-administered medications on file prior to visit.     BP 136/90   Temp 97.9 F (36.6 C) (Oral)   Wt 194 lb (88 kg)   BMI 26.27 kg/m       Objective:   Physical Exam  Constitutional: He appears well-developed and well-nourished. No distress.  Cardiovascular: Normal rate and regular rhythm.  Pulmonary/Chest: Effort normal and breath sounds normal.  Abdominal: Soft. Normal appearance. He exhibits no distension and no mass. Bowel sounds are increased. There is no hepatosplenomegaly, splenomegaly or hepatomegaly. There is no tenderness. There is no rebound and no guarding. No hernia.  Genitourinary: Rectal exam shows guaiac negative stool.  Skin: Skin is warm and dry. He is not diaphoretic.  Psychiatric: He has a normal mood and affect. His behavior is normal. Judgment and thought content normal.  Nursing note and vitals reviewed.     Assessment & Plan:  1. Diarrhea of presumed  infectious origin -We will get stool culture and treat prophylactically with Cipro 500 mg twice daily x10 days.  Advise close follow-up if no improvement in the next 2 or 3 days - Stool culture - ciprofloxacin (CIPRO) 500 MG tablet; Take 1 tablet (500 mg total) by mouth 2 (two) times daily.  Dispense: 20 tablet; Refill: 0  Shirline Frees, NP

## 2018-09-17 NOTE — Progress Notes (Signed)
Chief Complaint  Patient presents with  . Pruritis    x 3-4 weeks. Pt has tried several different lotions and creams. Not much changed with OTC tx - some reduced itching but still uncomfortable.     HPI: Joel Wright 32 y.o. come in for sda  PCP NA   Onset 4-5 weeks  o fitching on left shoulder  Then eventually all over  Forehead and back trunk and has a patch? On gu area but no other rash  . Son may have eczema   No new lotinos meds  Etc  And no  Illness fever etc.  Using otc aveeno other cuecerin   No antihistamines  And no hives or swelling gi gu sx  Rep sx  As a teen  Had seom tiching  Remote hx of tinea versicolot.   ROS: See pertinent positives and negatives per HPI. No new meds no one at home with all over itching  No travel exposures   fam hx dm and breast cancer  recnet use of minoxidil scalp but after onset of sx   Past Medical History:  Diagnosis Date  . Chicken pox   . Migraines     Family History  Problem Relation Age of Onset  . Hypertension Mother   . Breast cancer Mother   . Diabetes Mellitus II Mother   . Hyperlipidemia Father        2013  . Heart attack Father   . Colon cancer Maternal Grandmother        Diagnosed in 1994   . Heart attack Paternal Grandmother   . Esophageal cancer Paternal Grandfather     Social History   Socioeconomic History  . Marital status: Married    Spouse name: Joel Wright  . Number of children: 2  . Years of education: Not on file  . Highest education level: Not on file  Occupational History  . Not on file  Social Needs  . Financial resource strain: Not on file  . Food insecurity:    Worry: Not on file    Inability: Not on file  . Transportation needs:    Medical: Not on file    Non-medical: Not on file  Tobacco Use  . Smoking status: Never Smoker  . Smokeless tobacco: Never Used  Substance and Sexual Activity  . Alcohol use: Yes    Frequency: Never    Comment: occ  . Drug use: No  . Sexual activity: Yes    Lifestyle  . Physical activity:    Days per week: Not on file    Minutes per session: Not on file  . Stress: Not on file  Relationships  . Social connections:    Talks on phone: Not on file    Gets together: Not on file    Attends religious service: Not on file    Active member of club or organization: Not on file    Attends meetings of clubs or organizations: Not on file    Relationship status: Not on file  Other Topics Concern  . 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 ciprofloxacin (CIPRO) 500 MG tablet Take 1 tablet (500 mg total) by mouth 2 (two) times daily. 20 tablet 0  .  fluticasone (FLONASE) 50 MCG/ACT nasal spray Place 2 sprays into both nostrils daily. 16 g 6   No facility-administered medications prior to visit.      EXAM:  BP 122/82 (BP Location: Left Arm, Patient Position: Sitting, Cuff Size: Normal)   Pulse 74   Temp 98.3 F (36.8 C) (Oral)   Wt 200 lb 9.6 oz (91 kg)   BMI 27.17 kg/m   Body mass index is 27.17 kg/m.  GENERAL: vitals reviewed and listed above, alert, oriented, appears well hydrated and in no acute distress HEENT: atraumatic, conjunctiva  clear, no obvious abnormalities on inspection of external nose and ears OP : no lesion edema or exudate  NECK: no obvious masses on inspection palpation  LUNGS: clear to auscultation bilaterally, no wheezes, rales or rhonchi, good air movement CV: HRRR, no clubbing cyanosis or  peripheral edema nl cap refill  Abdomen:  Sof,t normal bowel sounds without hepatosplenomegaly, no guarding rebound or masses no CVA tenderness MS: moves all extremities without noticeable focal  abnormality PSYCH: pleasant and cooperative, no obvious depression or anxiety Skin: normal capillary refill ,turgor , color: No,petechiae or bruising  Dry skin   gu are  faded coins size patch faded no  Redness  Or pustules    No hives  Or redness  No burrows  Seen   No results found for: WBC, HGB, HCT, PLT, GLUCOSE, CHOL, TRIG, HDL, LDLDIRECT, LDLCALC, ALT, AST, NA, K, CL, CREATININE, BUN, CO2, TSH, PSA, INR, GLUF, HGBA1C, MICROALBUR BP Readings from Last 3 Encounters:  09/18/18 122/82  05/22/18 136/90  01/29/18 122/82   Wt Readings from Last 3 Encounters:  09/18/18 200 lb 9.6 oz (91 kg)  05/22/18 194 lb (88 kg)  01/29/18 194 lb (88 kg)     ASSESSMENT AND PLAN:  Discussed the following assessment and plan:  Pruritus - ? cause no obv rashx one area ? xerosis ,lab r/o underlying screen and fu  - Plan: CMP, CBC with Differential/Platelet, TSH, T4, free, Hemoglobin A1c, POCT Urinalysis Dipstick (Automated), Sedimentation rate  Need for influenza vaccination - Plan: Flu Vaccine QUAD 6+ mos PF IM (Fluarix Quad PF) Counseled. In  Skin  Hydration and   Expectant management. And fu PCP    -Patient advised to return or notify health care team  if  new concerns arise.  Patient Instructions  Your exam is reassuring  Usually itching is from dry skin and  Allergy but not certain . Checking lab to make sure  Thyroid and blood sugar etc is ok   Continue moisturizers and and otc antifungal on the one patch in  Genital area.   Add antihistamine   Benadryl  At night 25 mg and  Generic zyrtec  In day  Every day .   Can add on benadryl in day if needed.  Cool not hot showers .  There are other treatments  If needed  Plan rov in 2 weeks to see CN   For fu .   Pruritus Pruritus is an itching feeling. There are many different conditions and factors that can make your skin itchy. Dry skin is one of the most common causes of itching. Most cases of itching do not require medical attention. Itchy skin can turn into a rash. Follow these instructions at home: Watch your pruritus for any changes. Take these steps to help with your condition: Skin Care  Moisturize your  skin as needed. A moisturizer that contains petroleum jelly is best for keeping moisture in your  skin.  Take or apply medicines only as directed by your health care provider. This may include: ? Corticosteroid cream. ? Anti-itch lotions. ? Oral anti-histamines.  Apply cool compresses to the affected areas.  Try taking a bath with: ? Epsom salts. Follow the instructions on the packaging. You can get these at your local pharmacy or grocery store. ? Baking soda. Pour a small amount into the bath as directed by your health care provider. ? Colloidal oatmeal. Follow the instructions on the packaging. You can get this at your local pharmacy or grocery store.  Try applying baking soda paste to your skin. Stir water into baking soda until it reaches a paste-like consistency.  Do not scratch your skin.  Avoid hot showers or baths, which can make itching worse. A cold shower may help with itching as long as you use a moisturizer after.  Avoid scented soaps, detergents, and perfumes. Use gentle soaps, detergents, perfumes, and other cosmetic products. General instructions  Avoid wearing tight clothes.  Keep a journal to help track what causes your itch. Write down: ? What you eat. ? What cosmetic products you use. ? What you drink. ? What you wear. This includes jewelry.  Use a humidifier. This keeps the air moist, which helps to prevent dry skin. Contact a health care provider if:  The itching does not go away after several days.  You sweat at night.  You have weight loss.  You are unusually thirsty.  You urinate more than normal.  You are more tired than normal.  You have abdominal pain.  Your skin tingles.  You feel weak.  Your skin or the whites of your eyes look yellow (jaundice).  Your skin feels numb. This information is not intended to replace advice given to you by your health care provider. Make sure you discuss any questions you have with your health care  provider. Document Released: 06/22/2011 Document Revised: 03/17/2016 Document Reviewed: 10/06/2014 Elsevier Interactive Patient Education  2018 [employer].      [place]. Mainor Hellmann M.D.

## 2018-09-18 ENCOUNTER — Ambulatory Visit: Payer: BLUE CROSS/BLUE SHIELD | Admitting: Internal Medicine

## 2018-09-18 ENCOUNTER — Other Ambulatory Visit: Payer: Self-pay | Admitting: Internal Medicine

## 2018-09-18 ENCOUNTER — Encounter: Payer: Self-pay | Admitting: Internal Medicine

## 2018-09-18 VITALS — BP 122/82 | HR 74 | Temp 98.3°F | Wt 200.6 lb

## 2018-09-18 DIAGNOSIS — L299 Pruritus, unspecified: Secondary | ICD-10-CM | POA: Diagnosis not present

## 2018-09-18 DIAGNOSIS — Z23 Encounter for immunization: Secondary | ICD-10-CM

## 2018-09-18 DIAGNOSIS — R7989 Other specified abnormal findings of blood chemistry: Secondary | ICD-10-CM

## 2018-09-18 DIAGNOSIS — D721 Eosinophilia, unspecified: Secondary | ICD-10-CM

## 2018-09-18 DIAGNOSIS — D72819 Decreased white blood cell count, unspecified: Secondary | ICD-10-CM

## 2018-09-18 LAB — COMPREHENSIVE METABOLIC PANEL
ALBUMIN: 4.8 g/dL (ref 3.5–5.2)
ALK PHOS: 30 U/L — AB (ref 39–117)
ALT: 31 U/L (ref 0–53)
AST: 30 U/L (ref 0–37)
BILIRUBIN TOTAL: 1.3 mg/dL — AB (ref 0.2–1.2)
BUN: 15 mg/dL (ref 6–23)
CALCIUM: 9.7 mg/dL (ref 8.4–10.5)
CO2: 28 mEq/L (ref 19–32)
Chloride: 106 mEq/L (ref 96–112)
Creatinine, Ser: 0.84 mg/dL (ref 0.40–1.50)
GFR: 134.36 mL/min (ref >60.00)
GLUCOSE: 85 mg/dL (ref 70–99)
Potassium: 3.8 mEq/L (ref 3.5–5.1)
Sodium: 142 mEq/L (ref 135–145)
TOTAL PROTEIN: 7.5 g/dL (ref 6.0–8.3)

## 2018-09-18 LAB — POC URINALSYSI DIPSTICK (AUTOMATED)
Bilirubin, UA: NEGATIVE
Blood, UA: NEGATIVE
Glucose, UA: NEGATIVE
KETONES UA: NEGATIVE
LEUKOCYTES UA: NEGATIVE
Nitrite, UA: NEGATIVE
PH UA: 5.5 (ref 5.0–8.0)
PROTEIN UA: POSITIVE — AB
Spec Grav, UA: >=1.03 — AB (ref 1.010–1.025)
Urobilinogen, UA: 0.2 E.U./dL

## 2018-09-18 LAB — CBC WITH DIFFERENTIAL/PLATELET
BASOS ABS: 0 10*3/uL (ref 0.0–0.1)
Basophils Relative: 0.9 % (ref 0.0–3.0)
Eosinophils Absolute: 0.3 10*3/uL (ref 0.0–0.7)
Eosinophils Relative: 18.9 % — ABNORMAL HIGH (ref 0.0–5.0)
HCT: 39.9 % (ref 39.0–52.0)
Hemoglobin: 13.2 g/dL (ref 13.0–17.0)
LYMPHS ABS: 0.4 10*3/uL — AB (ref 0.7–4.0)
LYMPHS PCT: 26 % (ref 12.0–46.0)
MCHC: 33.1 g/dL (ref 30.0–36.0)
MCV: 81.7 fl (ref 78.0–100.0)
MONOS PCT: 6.9 % (ref 3.0–12.0)
Monocytes Absolute: 0.1 10*3/uL (ref 0.1–1.0)
NEUTROS PCT: 47.3 % (ref 43.0–77.0)
Neutro Abs: 0.8 10*3/uL — ABNORMAL LOW (ref 1.4–7.7)
Platelets: 140 10*3/uL — ABNORMAL LOW (ref 150.0–400.0)
RBC: 4.88 Mil/uL (ref 4.22–5.81)
RDW: 16.8 % — ABNORMAL HIGH (ref 11.5–15.5)

## 2018-09-18 LAB — SEDIMENTATION RATE: Sed Rate: 12 mm/hr (ref 0–15)

## 2018-09-18 LAB — TSH: TSH: 1.17 u[IU]/mL (ref 0.35–4.50)

## 2018-09-18 LAB — T4, FREE: Free T4: 0.83 ng/dL (ref 0.60–1.60)

## 2018-09-18 LAB — HEMOGLOBIN A1C: Hgb A1c MFr Bld: 5.8 % (ref 4.6–6.5)

## 2018-09-18 NOTE — Patient Instructions (Addendum)
Your exam is reassuring  Usually itching is from dry skin and  Allergy but not certain . Checking lab to make sure  Thyroid and blood sugar etc is ok   Continue moisturizers and and otc antifungal on the one patch in  Genital area.   Add antihistamine   Benadryl  At night 25 mg and  Generic zyrtec  In day  Every day .   Can add on benadryl in day if needed.  Cool not hot showers .  There are other treatments  If needed  Plan rov in 2 weeks to see CN   For fu .   Pruritus Pruritus is an itching feeling. There are many different conditions and factors that can make your skin itchy. Dry skin is one of the most common causes of itching. Most cases of itching do not require medical attention. Itchy skin can turn into a rash. Follow these instructions at home: Watch your pruritus for any changes. Take these steps to help with your condition: Skin Care  Moisturize your skin as needed. A moisturizer that contains petroleum jelly is best for keeping moisture in your skin.  Take or apply medicines only as directed by your health care provider. This may include: ? Corticosteroid cream. ? Anti-itch lotions. ? Oral anti-histamines.  Apply cool compresses to the affected areas.  Try taking a bath with: ? Epsom salts. Follow the instructions on the packaging. You can get these at your local pharmacy or grocery store. ? Baking soda. Pour a small amount into the bath as directed by your health care provider. ? Colloidal oatmeal. Follow the instructions on the packaging. You can get this at your local pharmacy or grocery store.  Try applying baking soda paste to your skin. Stir water into baking soda until it reaches a paste-like consistency.  Do not scratch your skin.  Avoid hot showers or baths, which can make itching worse. A cold shower may help with itching as long as you use a moisturizer after.  Avoid scented soaps, detergents, and perfumes. Use gentle soaps, detergents, perfumes, and  other cosmetic products. General instructions  Avoid wearing tight clothes.  Keep a journal to help track what causes your itch. Write down: ? What you eat. ? What cosmetic products you use. ? What you drink. ? What you wear. This includes jewelry.  Use a humidifier. This keeps the air moist, which helps to prevent dry skin. Contact a health care provider if:  The itching does not go away after several days.  You sweat at night.  You have weight loss.  You are unusually thirsty.  You urinate more than normal.  You are more tired than normal.  You have abdominal pain.  Your skin tingles.  You feel weak.  Your skin or the whites of your eyes look yellow (jaundice).  Your skin feels numb. This information is not intended to replace advice given to you by your health care provider. Make sure you discuss any questions you have with your health care provider. Document Released: 06/22/2011 Document Revised: 03/17/2016 Document Reviewed: 10/06/2014 Elsevier Interactive Patient Education  [employer]2018 [employer]

## 2018-09-19 ENCOUNTER — Telehealth: Payer: Self-pay | Admitting: Oncology

## 2018-09-19 NOTE — Telephone Encounter (Signed)
Scheduled appt per referral - pt is aware of appt date and time.

## 2018-09-26 ENCOUNTER — Telehealth: Payer: Self-pay | Admitting: *Deleted

## 2018-09-26 ENCOUNTER — Inpatient Hospital Stay: Payer: BLUE CROSS/BLUE SHIELD | Attending: Oncology | Admitting: Oncology

## 2018-09-26 ENCOUNTER — Inpatient Hospital Stay: Payer: BLUE CROSS/BLUE SHIELD

## 2018-09-26 ENCOUNTER — Telehealth: Payer: Self-pay | Admitting: Oncology

## 2018-09-26 VITALS — BP 127/88 | HR 64 | Temp 98.1°F | Resp 17 | Ht 72.05 in | Wt 209.0 lb

## 2018-09-26 DIAGNOSIS — Z79899 Other long term (current) drug therapy: Secondary | ICD-10-CM | POA: Insufficient documentation

## 2018-09-26 DIAGNOSIS — L209 Atopic dermatitis, unspecified: Secondary | ICD-10-CM

## 2018-09-26 DIAGNOSIS — D72819 Decreased white blood cell count, unspecified: Secondary | ICD-10-CM | POA: Diagnosis not present

## 2018-09-26 DIAGNOSIS — D721 Eosinophilia: Secondary | ICD-10-CM | POA: Diagnosis not present

## 2018-09-26 LAB — CBC WITH DIFFERENTIAL (CANCER CENTER ONLY)
Abs Immature Granulocytes: 0.02 10*3/uL (ref 0.00–0.07)
Basophils Absolute: 0 10*3/uL (ref 0.0–0.1)
Basophils Relative: 1 %
Eosinophils Absolute: 0.3 10*3/uL (ref 0.0–0.5)
Eosinophils Relative: 20 %
HCT: 38.9 % — ABNORMAL LOW (ref 39.0–52.0)
Hemoglobin: 12.5 g/dL — ABNORMAL LOW (ref 13.0–17.0)
Immature Granulocytes: 1 %
Lymphocytes Relative: 26 %
Lymphs Abs: 0.4 10*3/uL — ABNORMAL LOW (ref 0.7–4.0)
MCH: 26.8 pg (ref 26.0–34.0)
MCHC: 32.1 g/dL (ref 30.0–36.0)
MCV: 83.3 fL (ref 80.0–100.0)
MONO ABS: 0.1 10*3/uL (ref 0.1–1.0)
Monocytes Relative: 6 %
Neutro Abs: 0.7 10*3/uL — ABNORMAL LOW (ref 1.7–7.7)
Neutrophils Relative %: 46 %
Platelet Count: 136 10*3/uL — ABNORMAL LOW (ref 150–400)
RBC: 4.67 MIL/uL (ref 4.22–5.81)
RDW: 15.2 % (ref 11.5–15.5)
WBC Count: 1.6 10*3/uL — ABNORMAL LOW (ref 4.0–10.5)
nRBC: 0 % (ref 0.0–0.2)

## 2018-09-26 LAB — SAVE SMEAR(SSMR), FOR PROVIDER SLIDE REVIEW

## 2018-09-26 NOTE — Telephone Encounter (Signed)
-----   Message from Benjiman CoreFiras N Shadad, MD sent at 09/26/2018  2:10 PM EST ----- Please let him know his labs are unchanged. Will repeat it in 3 months like we discussed.

## 2018-09-26 NOTE — Progress Notes (Signed)
Reason for the request:    Eosinophilia  HPI: I was asked by Dr. Regis Bill to evaluate Joel Wright for eosinophilia.  He is a 32 year old healthy man seen by primary care physician on November 26 after complaining of pruritus for the last 3 to 4 weeks despite trying different things in multiple locations.  He was evaluated by Dr. Regis Bill and a CBC was obtained which showed a white cell count of 1.6, hemoglobin of 13.2 and a platelet count of 140.  His differential showed eosinophilia of 18.9% with an absolute eosinophilic count of 371.  He was prescribed Benadryl which has helped with his pruritus.  He did have skin rash that was very faint and has resolved at this time.  He feels reasonably well without any constitutional symptoms.  Denies any changes in his appetite or weight loss and actually have gained weight.  His performance status and quality of life remain excellent.  Denies any recent hospitalization or illnesses.  He does not report any headaches, blurry vision, syncope or seizures. Does not report any fevers, chills or sweats.  Does not report any cough, wheezing or hemoptysis.  Does not report any chest pain, palpitation, orthopnea or leg edema.  Does not report any nausea, vomiting or abdominal pain.  Does not report any constipation or diarrhea.  Does not report any skeletal complaints.    Does not report frequency, urgency or hematuria.  Does not report any skin rashes or lesions. Does not report any heat or cold intolerance.  Does not report any lymphadenopathy or petechiae.  Does not report any anxiety or depression.  Remaining review of systems is negative.    Past Medical History:  Diagnosis Date  . Chicken pox   . Migraines   :  Past Surgical History:  Procedure Laterality Date  . TONSILLECTOMY  03/2017  :  No current outpatient medications on file.:  Allergies  Allergen Reactions  . Other Hives    Oranges  :  Family History  Problem Relation Age of Onset  .  Hypertension Mother   . Breast cancer Mother   . Diabetes Mellitus II Mother   . Hyperlipidemia Father        2013  . Heart attack Father   . Colon cancer Maternal Grandmother        Diagnosed in 1994   . Heart attack Paternal Grandmother   . Esophageal cancer Paternal Grandfather   :  Social History   Socioeconomic History  . Marital status: Married    Spouse name: Lorne Skeens  . Number of children: 2  . Years of education: Not on file  . Highest education level: Not on file  Occupational History  . Not on file  Social Needs  . Financial resource strain: Not on file  . Food insecurity:    Worry: Not on file    Inability: Not on file  . Transportation needs:    Medical: Not on file    Non-medical: Not on file  Tobacco Use  . Smoking status: Never Smoker  . Smokeless tobacco: Never Used  Substance and Sexual Activity  . Alcohol use: Yes    Frequency: Never    Comment: occ  . Drug use: No  . Sexual activity: Yes  Lifestyle  . Physical activity:    Days per week: Not on file    Minutes per session: Not on file  . Stress: Not on file  Relationships  . Social connections:    Talks on phone:  Not on file    Gets together: Not on file    Attends religious service: Not on file    Active member of club or organization: Not on file    Attends meetings of clubs or organizations: Not on file    Relationship status: Not on file  . Intimate partner violence:    Fear of current or ex partner: Not on file    Emotionally abused: Not on file    Physically abused: Not on file    Forced sexual activity: Not on file  Other Topics Concern  . 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 rate 17, height 6' 0.05" (1.83 m), weight 209 lb (94.8 kg), SpO2 100 %.   ECOG 0 General appearance:  alert and cooperative appeared without distress. Head: atraumatic without any abnormalities. Eyes: conjunctivae/corneas clear. PERRL.  Sclera anicteric. Throat: lips, mucosa, and tongue normal; without oral thrush or ulcers. Resp: clear to auscultation bilaterally without rhonchi, wheezes or dullness to percussion. Cardio: regular rate and rhythm, S1, S2 normal, no murmur, click, rub or gallop GI: soft, non-tender; bowel sounds normal; no masses,  no organomegaly Skin:No rashes or lesions skin appears mildly dry. Lymph nodes: Cervical, supraclavicular, and axillary nodes normal. Neurologic: Grossly normal without any motor, sensory or deep tendon reflexes. Musculoskeletal: No joint deformity or effusion.  CBC    Component Value Date/Time   WBC 1.6 Repeated and verified X2. (LL) 09/18/2018 0936   RBC 4.88 09/18/2018 0936   HGB 13.2 09/18/2018 0936   HCT 39.9 09/18/2018 0936   PLT 140.0 (L) 09/18/2018 0936   MCV 81.7 09/18/2018 0936   MCHC 33.1 09/18/2018 0936   RDW 16.8 (H) 09/18/2018 0936   LYMPHSABS 0.4 (L) 09/18/2018 0936   MONOABS 0.1 09/18/2018 0936   EOSABS 0.3 09/18/2018 0936   BASOSABS 0.0 09/18/2018 0936     Assessment and Plan:   32 year old man with the following:  1.  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  This was in the setting of pruritus that has been noted for the last 3 to 4 weeks.  His pruritus is as a result of a faint rash that has resolved at this time.  He also has been taken minoxidil for the last 2 weeks.  The differential diagnosis for eosinophilia was reviewed today in detail.  Atopic dermatitis, allergy, drug reaction of the most likely causes.  Autoimmune diseases and malignant disorders are considered less likely.  Hematological condition needs to be ruled out in this particular setting including hypereosinophilic syndrome, eosinophilic leukemia and myeloproliferative disorder but  are considered very unlikely at this time.  The etiology of his eosinophilia most likely attributed to contact dermatitis or atopic dermatitis.  From a management standpoint, I have recommended close follow-up of his eosinophilia.  I will repeat a CBC today and in 3 months.  If hematological condition is suspected, further investigation may be needed including assessing pulmonary function test, cardiac function test with echocardiogram and a possible bone marrow biopsy to evaluate for myeloproliferative disorder.  I do not think this will be needed but there is a possibility that we are dealing with a myeloproliferative  disorder.  2.  Leukocytopenia: His total white cell count is low although it is unclear chronicity of this finding.  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  I will continue to monitor his counts periodically.  3.  Pruritus: He is currently on Benadryl and seems to be managing his symptoms.  If he develops a rash, I would favor obtaining tissue biopsy to rule out hematological manifestation such as mastocytosis.  4.  Follow-up: We will be in 3 months or sooner if his CBC showed any abnormalities.  40  minutes was spent with the patient face-to-face today.  More than 50% of time was dedicated to reviewing laboratory data, discussing differential diagnosis and answering questions regarding future plan of care.    Thank you for the referral.  A copy of this consult has been forwarded to the requesting physician.

## 2018-09-26 NOTE — Telephone Encounter (Signed)
Printed calendar and avs. °

## 2018-09-26 NOTE — Telephone Encounter (Signed)
Spoke with patient. Per dr Clelia Croftshadad, labs are unchanged and will repeat in 3 months as discussed at visit today.

## 2018-10-03 DIAGNOSIS — Z79899 Other long term (current) drug therapy: Secondary | ICD-10-CM | POA: Diagnosis not present

## 2018-10-03 DIAGNOSIS — L299 Pruritus, unspecified: Secondary | ICD-10-CM | POA: Diagnosis not present

## 2018-10-03 DIAGNOSIS — L2084 Intrinsic (allergic) eczema: Secondary | ICD-10-CM | POA: Diagnosis not present

## 2018-10-10 ENCOUNTER — Encounter: Payer: Self-pay | Admitting: Family Medicine

## 2018-11-09 ENCOUNTER — Encounter: Payer: Self-pay | Admitting: Family Medicine

## 2018-12-26 ENCOUNTER — Inpatient Hospital Stay (HOSPITAL_BASED_OUTPATIENT_CLINIC_OR_DEPARTMENT_OTHER): Payer: Self-pay | Admitting: Oncology

## 2018-12-26 ENCOUNTER — Inpatient Hospital Stay: Payer: Self-pay | Attending: Oncology

## 2018-12-26 VITALS — BP 117/77 | HR 67 | Temp 98.4°F | Resp 17 | Ht 72.05 in | Wt 198.2 lb

## 2018-12-26 DIAGNOSIS — L209 Atopic dermatitis, unspecified: Secondary | ICD-10-CM

## 2018-12-26 DIAGNOSIS — D709 Neutropenia, unspecified: Secondary | ICD-10-CM

## 2018-12-26 DIAGNOSIS — D72819 Decreased white blood cell count, unspecified: Secondary | ICD-10-CM

## 2018-12-26 DIAGNOSIS — D721 Eosinophilia: Secondary | ICD-10-CM | POA: Insufficient documentation

## 2018-12-26 LAB — CBC WITH DIFFERENTIAL (CANCER CENTER ONLY)
Abs Immature Granulocytes: 0.01 10*3/uL (ref 0.00–0.07)
BASOS ABS: 0 10*3/uL (ref 0.0–0.1)
Basophils Relative: 1 %
Eosinophils Absolute: 0.4 10*3/uL (ref 0.0–0.5)
Eosinophils Relative: 28 %
HCT: 39.7 % (ref 39.0–52.0)
Hemoglobin: 12.4 g/dL — ABNORMAL LOW (ref 13.0–17.0)
Immature Granulocytes: 1 %
Lymphocytes Relative: 33 %
Lymphs Abs: 0.4 10*3/uL — ABNORMAL LOW (ref 0.7–4.0)
MCH: 26.7 pg (ref 26.0–34.0)
MCHC: 31.2 g/dL (ref 30.0–36.0)
MCV: 85.4 fL (ref 80.0–100.0)
Monocytes Absolute: 0.1 10*3/uL (ref 0.1–1.0)
Monocytes Relative: 11 %
Neutro Abs: 0.3 10*3/uL — CL (ref 1.7–7.7)
Neutrophils Relative %: 26 %
Platelet Count: 148 10*3/uL — ABNORMAL LOW (ref 150–400)
RBC: 4.65 MIL/uL (ref 4.22–5.81)
RDW: 15.4 % (ref 11.5–15.5)
WBC Count: 1.3 10*3/uL — ABNORMAL LOW (ref 4.0–10.5)
nRBC: 0 % (ref 0.0–0.2)

## 2018-12-26 LAB — SAVE SMEAR(SSMR), FOR PROVIDER SLIDE REVIEW

## 2018-12-26 NOTE — Progress Notes (Signed)
Hematology and Oncology Follow Up Visit  Joel Wright [id number] Dec 21, 1985 33 y.o. 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  His eosinophilia felt to be reactive to atopic dermatitis and possible eczema diagnosed in December 2019.  Current therapy: Active surveillance.  Interim History: Mr. Joel Wright returns today for repeat evaluation.  Since last visit, he was evaluated by dermatology for pruritus and diagnosed with eczema and was prescribed topical creams including triamcinolone.  Despite the topical treatment he continues to have generalized pruritus with periodic patches of xerosis noted.  He denies any constitutional symptoms including fevers or chills or sweats.  He remains very active and attends activities of daily living.  He does not report any headaches, blurry vision, syncope or seizures. Does not report any fevers, chills or sweats.  Does not report any cough, wheezing or hemoptysis.  Does not report any chest pain, palpitation, orthopnea or leg edema.  Does not report any nausea, vomiting or abdominal pain.  Does not report any constipation or diarrhea.  Does not report any skeletal complaints.    Does not report frequency, urgency or hematuria.  Does not report any heat or cold intolerance.  Does not report any lymphadenopathy or petechiae.  Does not report any anxiety or depression.  Remaining review of systems is negative.    Medications: I have reviewed the patient's current medications.  Current Outpatient Medications  Medication Sig Dispense Refill  . hydrocortisone 2.5 % cream Apply topically 2 (two) times daily.    . hydrOXYzine (ATARAX/VISTARIL) 10 MG tablet Take 10 mg by mouth 3 (three) times daily as needed.    . triamcinolone cream (KENALOG) 0.1 % Apply 1 application topically 2 (two) times daily.     No current facility-administered medications for this visit.       Allergies:  Allergies  Allergen Reactions  . Other Hives    Oranges    Past Medical History, Surgical history, Social history, and Family History were reviewed and updated.   Physical Exam: Blood pressure 117/77, pulse 67, temperature 98.4 F (36.9 C), temperature source Oral, resp. rate 17, height 6' 0.05" (1.83 m), weight 198 lb 3.2 oz (89.9 kg), SpO2 100 %.   ECOG: 0   General appearance: Comfortable appearing without any discomfort Head: Normocephalic without any trauma Oropharynx: Mucous membranes are moist and pink without any thrush or ulcers. Eyes: Pupils are equal and round reactive to light. Lymph nodes: No cervical, supraclavicular, inguinal or axillary lymphadenopathy.   Heart:regular rate and rhythm.  S1 and S2 without leg edema. Lung: Clear without any rhonchi or wheezes.  No dullness to percussion. Abdomin: Soft, nontender, nondistended with good bowel sounds.  No hepatosplenomegaly. Musculoskeletal: No joint deformity or effusion.  Full range of motion noted. Neurological: No deficits noted on motor, sensory and deep tendon reflex exam. Skin: No petechial rash or dryness.  Appeared moist.  Psychiatric: Mood and affect appeared appropriate.    Lab Results: Lab Results  Component Value Date   WBC 1.6 (L) 09/26/2018   HGB 12.5 (L) 09/26/2018   HCT 38.9 (L) 09/26/2018   MCV 83.3 09/26/2018   PLT 136 (L) 09/26/2018     Chemistry      Component Value Date/Time   NA 142 09/18/2018 0936   K 3.8 09/18/2018 0936   CL 106 09/18/2018 0936   CO2 28 09/18/2018 0936   BUN 15 09/18/2018 0936   CREATININE 0.84  09/18/2018 0936      Component Value Date/Time   CALCIUM 9.7 09/18/2018 0936   ALKPHOS 30 (L) 09/18/2018 0936   AST 30 09/18/2018 0936   ALT 31 09/18/2018 0936   BILITOT 1.3 (H) 09/18/2018 0936       Radiological Studies:    Impression and Plan:  33 year old man with:  1.  Eosinophilia presented in December 2019 with increased  percentage up to 18.9 in the setting of atopic dermatitis and possible eczema.   His eosinophilia felt to be secondary and not a sign of a primary hematological condition initially.  The differential diagnosis was reviewed again which include predominantly secondary causes including dermatitis and allergy.  Primary eosinophilic syndromes and myeloproliferative disorders are are also a consideration.  His CBC was reviewed today and continues to show eosinophilia that has worsened since the last visit.  He has also neutropenia which also has worsened in the process.  All these findings can be explained by secondary causes although I cannot rule out myeloproliferative disorder definitively.  Given these findings, I have recommended proceeding with a bone marrow biopsy for full evaluation.  Complication associated with this treatment include pain, infection or bleeding which are rather rare.  He will consider this option and let me know in the immediate future.  2.  Leukocytopenia: With associated neutropenia.  His peripheral smear was personally reviewed today and did not show any evidence of dysplasia or blasts.  This could be related to his secondary eosinophilia, versus ethnic variation.  Bone marrow biopsy still recommended in this setting.  3.  Pruritus: He has been evaluated by dermatology and was diagnosed with atopic dermatitis and possible eczema.  He is using topical creams related to that.  4.  Follow-up: We will be after bone marrow biopsy once he decides to proceed with that.  25  minutes was spent with the patient face-to-face today.  More than 50% of time was dedicated to discussing differential diagnosis, reviewing laboratory data including his peripheral smear and answering questions regarding future plan of care.     Zola Button, MD 3/4/202010:29 AM

## 2018-12-27 ENCOUNTER — Telehealth: Payer: Self-pay | Admitting: Oncology

## 2018-12-27 NOTE — Telephone Encounter (Signed)
No 3/4 los °

## 2019-03-14 ENCOUNTER — Inpatient Hospital Stay (HOSPITAL_COMMUNITY)
Admission: EM | Admit: 2019-03-14 | Discharge: 2019-03-16 | DRG: 808 | Disposition: A | Discharge status: Home / Self Care | Payer: Commercial Managed Care - PPO | Attending: Internal Medicine | Admitting: Internal Medicine

## 2019-03-14 ENCOUNTER — Inpatient Hospital Stay (HOSPITAL_COMMUNITY): Payer: Commercial Managed Care - PPO

## 2019-03-14 ENCOUNTER — Other Ambulatory Visit: Payer: Self-pay

## 2019-03-14 ENCOUNTER — Ambulatory Visit: Payer: Self-pay

## 2019-03-14 ENCOUNTER — Emergency Department (HOSPITAL_COMMUNITY): Payer: Commercial Managed Care - PPO

## 2019-03-14 DIAGNOSIS — J019 Acute sinusitis, unspecified: Secondary | ICD-10-CM | POA: Diagnosis not present

## 2019-03-14 DIAGNOSIS — R5081 Fever presenting with conditions classified elsewhere: Secondary | ICD-10-CM

## 2019-03-14 DIAGNOSIS — R4701 Aphasia: Secondary | ICD-10-CM | POA: Diagnosis present

## 2019-03-14 DIAGNOSIS — R509 Fever, unspecified: Secondary | ICD-10-CM | POA: Diagnosis present

## 2019-03-14 DIAGNOSIS — J018 Other acute sinusitis: Secondary | ICD-10-CM | POA: Diagnosis present

## 2019-03-14 DIAGNOSIS — D696 Thrombocytopenia, unspecified: Secondary | ICD-10-CM | POA: Diagnosis present

## 2019-03-14 DIAGNOSIS — Z79899 Other long term (current) drug therapy: Secondary | ICD-10-CM | POA: Diagnosis not present

## 2019-03-14 DIAGNOSIS — G9340 Encephalopathy, unspecified: Secondary | ICD-10-CM | POA: Diagnosis not present

## 2019-03-14 DIAGNOSIS — N179 Acute kidney failure, unspecified: Secondary | ICD-10-CM | POA: Diagnosis present

## 2019-03-14 DIAGNOSIS — Z20828 Contact with and (suspected) exposure to other viral communicable diseases: Secondary | ICD-10-CM | POA: Diagnosis present

## 2019-03-14 DIAGNOSIS — G9341 Metabolic encephalopathy: Secondary | ICD-10-CM | POA: Diagnosis present

## 2019-03-14 DIAGNOSIS — L209 Atopic dermatitis, unspecified: Secondary | ICD-10-CM | POA: Diagnosis present

## 2019-03-14 DIAGNOSIS — D721 Eosinophilia: Secondary | ICD-10-CM | POA: Diagnosis present

## 2019-03-14 DIAGNOSIS — Z79631 Long term (current) use of antimetabolite agent: Secondary | ICD-10-CM

## 2019-03-14 DIAGNOSIS — E876 Hypokalemia: Secondary | ICD-10-CM | POA: Diagnosis present

## 2019-03-14 DIAGNOSIS — D61818 Other pancytopenia: Secondary | ICD-10-CM | POA: Diagnosis present

## 2019-03-14 DIAGNOSIS — D709 Neutropenia, unspecified: Secondary | ICD-10-CM | POA: Diagnosis present

## 2019-03-14 LAB — URINALYSIS, ROUTINE W REFLEX MICROSCOPIC
Bacteria, UA: NONE SEEN
Bilirubin Urine: NEGATIVE
Glucose, UA: NEGATIVE mg/dL
Ketones, ur: NEGATIVE mg/dL
Leukocytes,Ua: NEGATIVE
Nitrite: NEGATIVE
Protein, ur: 30 mg/dL — AB
Specific Gravity, Urine: 1.006 (ref 1.005–1.030)
pH: 5 (ref 5.0–8.0)

## 2019-03-14 LAB — CSF CELL COUNT WITH DIFFERENTIAL
RBC Count, CSF: 0 /mm3
Tube #: 1
WBC, CSF: 0 /mm3 (ref 0–5)

## 2019-03-14 LAB — PROCALCITONIN: Procalcitonin: 1.26 ng/mL

## 2019-03-14 LAB — CBC WITH DIFFERENTIAL/PLATELET
Abs Immature Granulocytes: 0 10*3/uL (ref 0.00–0.07)
Basophils Absolute: 0 10*3/uL (ref 0.0–0.1)
Basophils Relative: 0 %
Eosinophils Absolute: 0 10*3/uL (ref 0.0–0.5)
Eosinophils Relative: 3 %
HCT: 37.2 % — ABNORMAL LOW (ref 39.0–52.0)
Hemoglobin: 11.8 g/dL — ABNORMAL LOW (ref 13.0–17.0)
Lymphocytes Relative: 16 %
Lymphs Abs: 0.2 10*3/uL — ABNORMAL LOW (ref 0.7–4.0)
MCH: 26.9 pg (ref 26.0–34.0)
MCHC: 31.7 g/dL (ref 30.0–36.0)
MCV: 84.7 fL (ref 80.0–100.0)
Monocytes Absolute: 0.1 10*3/uL (ref 0.1–1.0)
Monocytes Relative: 5 %
Neutro Abs: 0.9 10*3/uL — ABNORMAL LOW (ref 1.7–7.7)
Neutrophils Relative %: 76 %
Platelets: 137 10*3/uL — ABNORMAL LOW (ref 150–400)
RBC: 4.39 MIL/uL (ref 4.22–5.81)
RDW: 16.6 % — ABNORMAL HIGH (ref 11.5–15.5)
WBC: 1.2 10*3/uL — CL (ref 4.0–10.5)
nRBC: 0 % (ref 0.0–0.2)
nRBC: 4 /100 WBC — ABNORMAL HIGH

## 2019-03-14 LAB — POCT I-STAT EG7
Acid-base deficit: 1 mmol/L (ref 0.0–2.0)
Bicarbonate: 21.8 mmol/L (ref 20.0–28.0)
Calcium, Ion: 1.09 mmol/L — ABNORMAL LOW (ref 1.15–1.40)
HCT: 32 % — ABNORMAL LOW (ref 39.0–52.0)
Hemoglobin: 10.9 g/dL — ABNORMAL LOW (ref 13.0–17.0)
O2 Saturation: 94 %
Potassium: 3 mmol/L — ABNORMAL LOW (ref 3.5–5.1)
Sodium: 139 mmol/L (ref 135–145)
TCO2: 23 mmol/L (ref 22–32)
pCO2, Ven: 30.4 mmHg — ABNORMAL LOW (ref 44.0–60.0)
pH, Ven: 7.462 — ABNORMAL HIGH (ref 7.250–7.430)
pO2, Ven: 66 mmHg — ABNORMAL HIGH (ref 32.0–45.0)

## 2019-03-14 LAB — COMPREHENSIVE METABOLIC PANEL
ALT: 41 U/L (ref 0–44)
AST: 55 U/L — ABNORMAL HIGH (ref 15–41)
Albumin: 4 g/dL (ref 3.5–5.0)
Alkaline Phosphatase: 33 U/L — ABNORMAL LOW (ref 38–126)
Anion gap: 11 (ref 5–15)
BUN: 13 mg/dL (ref 6–20)
CO2: 23 mmol/L (ref 22–32)
Calcium: 9.4 mg/dL (ref 8.9–10.3)
Chloride: 104 mmol/L (ref 98–111)
Creatinine, Ser: 1.26 mg/dL — ABNORMAL HIGH (ref 0.61–1.24)
GFR calc Af Amer: >60 mL/min (ref >60)
GFR calc non Af Amer: >60 mL/min (ref >60)
Glucose, Bld: 119 mg/dL — ABNORMAL HIGH (ref 70–99)
Potassium: 3.2 mmol/L — ABNORMAL LOW (ref 3.5–5.1)
Sodium: 138 mmol/L (ref 135–145)
Total Bilirubin: 1.3 mg/dL — ABNORMAL HIGH (ref 0.3–1.2)
Total Protein: 7.5 g/dL (ref 6.5–8.1)

## 2019-03-14 LAB — PROTEIN AND GLUCOSE, CSF
Glucose, CSF: 59 mg/dL (ref 40–70)
Total  Protein, CSF: 29 mg/dL (ref 15–45)

## 2019-03-14 LAB — LACTIC ACID, PLASMA: Lactic Acid, Venous: 1.3 mmol/L (ref 0.5–1.9)

## 2019-03-14 LAB — CRYPTOCOCCAL ANTIGEN, CSF: Crypto Ag: NEGATIVE

## 2019-03-14 LAB — LIPASE, BLOOD: Lipase: 38 U/L (ref 11–51)

## 2019-03-14 LAB — SARS CORONAVIRUS 2 BY RT PCR (HOSPITAL ORDER, PERFORMED IN ~~LOC~~ HOSPITAL LAB): SARS Coronavirus 2: NEGATIVE

## 2019-03-14 LAB — TSH: TSH: 0.433 u[IU]/mL (ref 0.350–4.500)

## 2019-03-14 LAB — AMMONIA: Ammonia: 21 umol/L (ref 9–35)

## 2019-03-14 LAB — PROTIME-INR
INR: 1.2 (ref 0.8–1.2)
Prothrombin Time: 14.8 seconds (ref 11.4–15.2)

## 2019-03-14 MED ORDER — ACETAMINOPHEN 325 MG PO TABS
650.0000 mg | ORAL_TABLET | Freq: Four times a day (QID) | ORAL | Status: DC | PRN
  Administered 2019-03-14 – 2019-03-16 (×3): 650 mg via ORAL
  Filled 2019-03-14 (×3): qty 2

## 2019-03-14 MED ORDER — LACTATED RINGERS IV BOLUS
1000.0000 mL | Freq: Once | INTRAVENOUS | Status: AC
  Administered 2019-03-14: 1000 mL via INTRAVENOUS

## 2019-03-14 MED ORDER — ONDANSETRON HCL 4 MG/2ML IJ SOLN: 4.0000 mg | Freq: Four times a day (QID) | INTRAMUSCULAR | Status: DC | PRN

## 2019-03-14 MED ORDER — ACETAMINOPHEN 650 MG RE SUPP: 650.0000 mg | Freq: Four times a day (QID) | RECTAL | Status: DC | PRN

## 2019-03-14 MED ORDER — POTASSIUM CHLORIDE CRYS ER 20 MEQ PO TBCR
40.0000 meq | EXTENDED_RELEASE_TABLET | ORAL | Status: AC
  Administered 2019-03-14: 40 meq via ORAL
  Filled 2019-03-14: qty 2

## 2019-03-14 MED ORDER — VANCOMYCIN HCL 10 G IV SOLR
2000.0000 mg | Freq: Once | INTRAVENOUS | Status: AC
  Administered 2019-03-14: 2000 mg via INTRAVENOUS
  Filled 2019-03-14: qty 2000

## 2019-03-14 MED ORDER — FOLIC ACID 1 MG PO TABS
1.0000 mg | ORAL_TABLET | Freq: Every day | ORAL | Status: DC
  Administered 2019-03-14 – 2019-03-16 (×3): 1 mg via ORAL
  Filled 2019-03-14 (×3): qty 1

## 2019-03-14 MED ORDER — SODIUM CHLORIDE 0.9% FLUSH
3.0000 mL | Freq: Two times a day (BID) | INTRAVENOUS | Status: DC
  Administered 2019-03-14 – 2019-03-16 (×4): 3 mL via INTRAVENOUS

## 2019-03-14 MED ORDER — LIDOCAINE-EPINEPHRINE (PF) 2 %-1:200000 IJ SOLN
10.0000 mL | Freq: Once | INTRAMUSCULAR | Status: AC
  Administered 2019-03-14: 10 mL via INTRADERMAL
  Filled 2019-03-14: qty 20

## 2019-03-14 MED ORDER — SODIUM CHLORIDE 0.9 % IV SOLN
2.0000 g | Freq: Two times a day (BID) | INTRAVENOUS | Status: DC
  Administered 2019-03-14 – 2019-03-15 (×2): 2 g via INTRAVENOUS
  Filled 2019-03-14 (×2): qty 20

## 2019-03-14 MED ORDER — SODIUM CHLORIDE 0.9 % IV SOLN
2.0000 g | Freq: Once | INTRAVENOUS | Status: AC
  Administered 2019-03-14: 2 g via INTRAVENOUS
  Filled 2019-03-14: qty 20

## 2019-03-14 MED ORDER — ACETAMINOPHEN 325 MG PO TABS
325.0000 mg | ORAL_TABLET | Freq: Once | ORAL | Status: AC
  Administered 2019-03-14: 325 mg via ORAL
  Filled 2019-03-14: qty 1

## 2019-03-14 MED ORDER — ONDANSETRON HCL 4 MG PO TABS: 4.0000 mg | ORAL_TABLET | Freq: Four times a day (QID) | ORAL | Status: DC | PRN

## 2019-03-14 MED ORDER — ENOXAPARIN SODIUM 40 MG/0.4ML ~~LOC~~ SOLN
40.0000 mg | SUBCUTANEOUS | Status: DC
  Administered 2019-03-14 – 2019-03-15 (×2): 40 mg via SUBCUTANEOUS
  Filled 2019-03-14 (×3): qty 0.4

## 2019-03-14 MED ORDER — DEXAMETHASONE SODIUM PHOSPHATE 10 MG/ML IJ SOLN
10.0000 mg | Freq: Once | INTRAMUSCULAR | Status: AC
  Administered 2019-03-14: 10 mg via INTRAVENOUS
  Filled 2019-03-14: qty 1

## 2019-03-14 MED ORDER — ALBUTEROL SULFATE (2.5 MG/3ML) 0.083% IN NEBU: 2.5000 mg | INHALATION_SOLUTION | Freq: Four times a day (QID) | RESPIRATORY_TRACT | Status: DC | PRN

## 2019-03-14 MED ORDER — SODIUM CHLORIDE 0.9 % IV SOLN
INTRAVENOUS | Status: DC
  Administered 2019-03-14 – 2019-03-15 (×3): via INTRAVENOUS

## 2019-03-14 MED ORDER — ACETAMINOPHEN 325 MG PO TABS
650.0000 mg | ORAL_TABLET | Freq: Four times a day (QID) | ORAL | Status: DC | PRN
  Administered 2019-03-14: 650 mg via ORAL
  Filled 2019-03-14: qty 2

## 2019-03-14 MED ORDER — SODIUM CHLORIDE 0.9 % IV BOLUS
500.0000 mL | Freq: Once | INTRAVENOUS | Status: AC
  Administered 2019-03-14: 500 mL via INTRAVENOUS

## 2019-03-14 MED ORDER — VANCOMYCIN HCL 10 G IV SOLR
1250.0000 mg | Freq: Two times a day (BID) | INTRAVENOUS | Status: DC
  Administered 2019-03-15: 1250 mg via INTRAVENOUS
  Filled 2019-03-14 (×3): qty 1250

## 2019-03-14 NOTE — ED Notes (Addendum)
Dr. Laroy Apple aware of 1.2 WBC, no further orders received

## 2019-03-14 NOTE — Progress Notes (Signed)
Patient admitted to room 5w01 for neutropenic fever. Alert orientedx3 room air. Neutropenic precautions remains on droplet and contact precautions. Admission completed and oriented to unit. Bed alarm on, bed in lowest position for safety precautions.

## 2019-03-14 NOTE — ED Triage Notes (Signed)
Pt c/o body ache , headache, weakness , and woke up feeling " confused " pt denies any sob or cough or chest pain; pt alert and oriented x4 and following simple commands

## 2019-03-14 NOTE — Progress Notes (Signed)
Pharmacy Antibiotic Note  Yvette Evelyn Farhan Lehn. is a 33 y.o. male admitted on 03/14/2019 with meningitis.  Pharmacy has been consulted for vancomycin dosing. Tmax 103.2, WBC 1.2. Scr 1.26 (baseline 0.8).   Vancomycin 1250 mg IV Q 12 hrs. Goal AUC 400-550. Expected AUC: 482 SCr used: 1.26  Plan: Vancomycin 2000 mg IV x1 in the ED, then 1250 mg IV q12h Monitor clinical status, renal function, cultures, and length of therapy Monitor levels as indicated  Height: 5\' 11"  (180.3 cm) Weight: 205 lb (93 kg) IBW/kg (Calculated) : 75.3  Temp (24hrs), Avg:101.3 F (38.5 C), Min:99.7 F (37.6 C), Max:103.2 F (39.6 C)  Recent Labs  Lab 03/14/19 0940  WBC 1.2*  CREATININE 1.26*  LATICACIDVEN 1.3    Estimated Creatinine Clearance: 96.3 mL/min (A) (by C-G formula based on SCr of 1.26 mg/dL (H)).    Allergies  Allergen Reactions  . Other Hives    Oranges    Antimicrobials this admission: Vancomycin 5/21 >> Ceftriaxone 5/21 >>  Dose adjustments this admission:   Microbiology results: 5/21 CSF: no organisms seen 5/21 UCx: 5/21 BCx:  5/21 COIVD: negative  Thank you for allowing pharmacy to be a part of this patient's care.  Marcelino Freestone, PharmD PGY2 Cardiology Pharmacy Resident Please check AMION for all Pharmacist numbers by unit 03/14/2019 3:08 PM

## 2019-03-14 NOTE — Plan of Care (Signed)

## 2019-03-14 NOTE — ED Notes (Signed)
Consent signed by MD , Patient, and witness , consent at bedside

## 2019-03-14 NOTE — H&P (Addendum)
History and Physical    Tremond Shimabukuro [employer]. GYF:[id number] DOB: 11-09-1985 DOA: 03/14/2019  Referring MD/NP/PA: Pennelope Bracken, MD (resident) PCP: Dorothyann Peng, NP  Patient coming from: home  Chief Complaint: Fever  I have personally briefly reviewed patient's old medical records in [place]   HPI: Gladstone Rosas [facility]. is a 33 y.o. male with medical history significant of atopic dermatitis, leukopenia, eosinophilia, and immunocompromised taking methotrexate; who presents with fever and general malaise over the last 3 days.  He really notes that he is "work every day early since Monday.  Associated symptoms include feeling dizzy, headache with pain behind his eyes, neck discomfort, confusion, and difficulty getting his words out.  He states that this was the third or fourth week of being on methotrexate and he took his last dose on 5/18. Wife noted that his lymph nodes in his neck were swelling for the last week and had been sleeping.    ED Course: Upon admission to the emergency department patient was noted to be febrile up to 103.2 F, respirations 13-24, and all other vital signs maintained.  Labs revealed WBC 1.2, hemoglobin 11.8, platelets 137, potassium 3.2, BUN 13, creatinine 1.26, AST 55, pro calcitonin 1.26, and lactic acid 1.3.  Urinalysis was negative for any signs of infection.  CT scan of the brain showed multifocal paranasal sinus disease.  Patient was given Tylenol, 1.5 L of IV fluids, Rocephin 2 g, and vancomycin.  TRH called to admit.    Review of Systems  Constitutional: Positive for chills, fever and malaise/fatigue.  HENT: Positive for congestion.   Eyes: Negative for photophobia.  Respiratory: Negative for cough and shortness of breath.   Cardiovascular: Negative for chest pain and leg swelling.  Gastrointestinal: Negative for abdominal pain, nausea and vomiting.  Genitourinary: Negative for dysuria and hematuria.  Musculoskeletal: Positive for neck pain.   Skin: Positive for rash.  Neurological: Positive for dizziness, speech change and headaches. Negative for loss of consciousness.  Endo/Heme/Allergies: Negative for polydipsia. Does not bruise/bleed easily.  Psychiatric/Behavioral: Negative for hallucinations. The patient is not nervous/anxious.        Positive for confusion    Past Medical History:  Diagnosis Date  . Chicken pox   . Migraines     Past Surgical History:  Procedure Laterality Date  . TONSILLECTOMY  03/2017     reports that he has never smoked. He has never used smokeless tobacco. He reports current alcohol use. He reports that he does not use drugs.  Allergies  Allergen Reactions  . Other Hives    Oranges    Family History  Problem Relation Age of Onset  . Hypertension Mother   . Breast cancer Mother   . Diabetes Mellitus II Mother   . Hyperlipidemia Father        2013  . Heart attack Father   . Colon cancer Maternal Grandmother        Diagnosed in 1994   . Heart attack Paternal Grandmother   . Esophageal cancer Paternal Grandfather     Prior to Admission medications   Medication Sig Start Date End Date Taking? Authorizing Provider  folic acid (FOLVITE) 1 MG tablet Take 1 mg by mouth daily. 02/18/19  Yes [provider]  methotrexate (RHEUMATREX) 2.5 MG tablet Take 7.5 mg by mouth See admin instructions. Take on Sunday and Monday in the evening 02/18/19  Yes [provider]    Physical Exam:  Constitutional: Young male who appears to  be acutely ill Vitals:   03/14/19 1200 03/14/19 1300 03/14/19 1332 03/14/19 1341  BP: (!) 121/55 104/65  122/79  Pulse: 90 90  66  Resp: 20 17  13   Temp:   99.7 F (37.6 C)   TempSrc:   Oral   SpO2: 98% 100%  100%  Weight:      Height:       Eyes: PERRL, lids and conjunctivae normal ENMT: Mucous membranes are dry.  Posterior pharynx clear of any exudate or lesions.   Neck: normal, supple, no masses, no thyromegaly.  No significant rigidity  noted.  Tenderness to palpation. Respiratory: clear to auscultation bilaterally, no wheezing, no crackles. Normal respiratory effort. No accessory muscle use.  Cardiovascular: Regular rate and rhythm, no murmurs / rubs / gallops. No extremity edema. 2+ pedal pulses. No carotid bruits.  Abdomen: no tenderness, no masses palpated. No hepatosplenomegaly. Bowel sounds positive.  Musculoskeletal: no clubbing / cyanosis. No joint deformity upper and lower extremities. Good ROM, no contractures. Normal muscle tone.  Skin: atopic dermatitis  Neurologic: CN 2-12 grossly intact. Sensation intact, DTR normal. Strength 5/5 in all 4.  Psychiatric: Normal judgment and insight. Alert and oriented x 3. Normal mood.     Labs on Admission: I have personally reviewed following labs and imaging studies  CBC: Recent Labs  Lab 03/14/19 0940 03/14/19 1056  WBC 1.2*  --   NEUTROABS 0.9*  --   HGB 11.8* 10.9*  HCT 37.2* 32.0*  MCV 84.7  --   PLT 137*  --    Basic Metabolic Panel: Recent Labs  Lab 03/14/19 0940 03/14/19 1056  NA 138 139  K 3.2* 3.0*  CL 104  --   CO2 23  --   GLUCOSE 119*  --   BUN 13  --   CREATININE 1.26*  --   CALCIUM 9.4  --    GFR: Estimated Creatinine Clearance: 96.3 mL/min (A) (by C-G formula based on SCr of 1.26 mg/dL (H)). Liver Function Tests: Recent Labs  Lab 03/14/19 0940  AST 55*  ALT 41  ALKPHOS 33*  BILITOT 1.3*  PROT 7.5  ALBUMIN 4.0   Recent Labs  Lab 03/14/19 0940  LIPASE 38   No results for input(s): AMMONIA in the last 168 hours. Coagulation Profile: Recent Labs  Lab 03/14/19 1005  INR 1.2   Cardiac Enzymes: No results for input(s): CKTOTAL, CKMB, CKMBINDEX, TROPONINI in the last 168 hours. BNP (last 3 results) No results for input(s): PROBNP in the last 8760 hours. HbA1C: No results for input(s): HGBA1C in the last 72 hours. CBG: No results for input(s): GLUCAP in the last 168 hours. Lipid Profile: No results for input(s): CHOL,  HDL, LDLCALC, TRIG, CHOLHDL, LDLDIRECT in the last 72 hours. Thyroid Function Tests: No results for input(s): TSH, T4TOTAL, FREET4, T3FREE, THYROIDAB in the last 72 hours. Anemia Panel: No results for input(s): VITAMINB12, FOLATE, FERRITIN, TIBC, IRON, RETICCTPCT in the last 72 hours. Urine analysis:    Component Value Date/Time   COLORURINE YELLOW 03/14/2019 [place] 03/14/2019 1221   LABSPEC 1.006 03/14/2019 1221   PHURINE 5.0 03/14/2019 1221   GLUCOSEU NEGATIVE 03/14/2019 1221   HGBUR MODERATE (A) 03/14/2019 1221   BILIRUBINUR NEGATIVE 03/14/2019 1221   BILIRUBINUR n 09/18/2018 1035   KETONESUR NEGATIVE 03/14/2019 1221   PROTEINUR 30 (A) 03/14/2019 1221   UROBILINOGEN 0.2 09/18/2018 1035   NITRITE NEGATIVE 03/14/2019 [place] 03/14/2019 1221   Sepsis Labs:  Recent Results (from the past 240 hour(s))  SARS Coronavirus 2 (CEPHEID- Performed in [place] hospital lab), Hosp Order     Status: None   Collection Time: 03/14/19 10:01 AM  Result Value Ref Range Status   SARS Coronavirus 2 NEGATIVE NEGATIVE Final    Comment: (NOTE) If result is NEGATIVE SARS-CoV-2 target nucleic acids are NOT DETECTED. The SARS-CoV-2 RNA is generally detectable in upper and lower  respiratory specimens during the acute phase of infection. The lowest  concentration of SARS-CoV-2 viral copies this assay can detect is 250  copies / mL. A negative result does not preclude SARS-CoV-2 infection  and should not be used as the sole basis for treatment or other  patient management decisions.  A negative result may occur with  improper specimen collection / handling, submission of specimen other  than nasopharyngeal swab, presence of viral mutation(s) within the  areas targeted by this assay, and inadequate number of viral copies  (<250 copies / mL). A negative result must be combined with clinical  observations, patient history, and epidemiological information. If  result is POSITIVE SARS-CoV-2 target nucleic acids are DETECTED. The SARS-CoV-2 RNA is generally detectable in upper and lower  respiratory specimens dur ing the acute phase of infection.  Positive  results are indicative of active infection with SARS-CoV-2.  Clinical  correlation with patient history and other diagnostic information is  necessary to determine patient infection status.  Positive results do  not rule out bacterial infection or co-infection with other viruses. If result is PRESUMPTIVE POSTIVE SARS-CoV-2 nucleic acids MAY BE PRESENT.   A presumptive positive result was obtained on the submitted specimen  and confirmed on repeat testing.  While 2019 novel coronavirus  (SARS-CoV-2) nucleic acids may be present in the submitted sample  additional confirmatory testing may be necessary for epidemiological  and / or clinical management purposes  to differentiate between  SARS-CoV-2 and other Sarbecovirus currently known to infect humans.  If clinically indicated additional testing with an alternate test  methodology [phone]) is advised. The SARS-CoV-2 RNA is generally  detectable in upper and lower respiratory sp ecimens during the acute  phase of infection. The expected result is Negative. Fact Sheet for Patients:  StrictlyIdeas.no Fact Sheet for Healthcare Providers: BankingDealers.co.za This test is not yet approved or cleared by the [place] FDA and has been authorized for detection and/or diagnosis of SARS-CoV-2 by FDA under an Emergency Use Authorization (EUA).  This EUA will remain in effect (meaning this test can be used) for the duration of the COVID-19 declaration under Section 564(b)(1) of the Act, 21 U.S.C. section 360bbb-3(b)(1), unless the authorization is terminated or revoked sooner. Performed at [facility], [place] [address], [place], [place] [address]      Radiological Exams on Admission: Ct Head  Wo Contrast  Result Date: 03/14/2019 CLINICAL DATA:  Confusion EXAM: CT HEAD WITHOUT CONTRAST TECHNIQUE: Contiguous axial images were obtained from the base of the skull through the vertex without intravenous contrast. COMPARISON:  None. FINDINGS: Brain: The ventricles are normal in size and configuration. There is no intracranial mass, hemorrhage, extra-axial fluid collection, or midline shift. The brain parenchyma appears unremarkable. No acute infarct evident. Vascular: There is no appreciable hyperdense vessel. There is no appreciable vascular calcification. Skull: Bony calvarium appears intact. Sinuses/Orbits: There is mucosal thickening in maxillary antra bilaterally, more severe on the right than on the left. There is mucosal thickening and opacification in multiple ethmoid air cells. There is slight  mucosal thickening in the anterior sphenoid sinus regions. Orbits bilaterally appear unremarkable and symmetric. Other: Mastoid air cells are clear. IMPRESSION: Multifocal paranasal sinus disease.  Study otherwise unremarkable. Electronically Signed   By: Lowella Grip III M.D.   On: 03/14/2019 11:08   Dg Chest Port 1 View  Result Date: 03/14/2019 CLINICAL DATA:  Fevers and headaches EXAM: PORTABLE CHEST 1 VIEW COMPARISON:  01/29/2018 FINDINGS: The heart size and mediastinal contours are within normal limits. Both lungs are clear. The visualized skeletal structures are unremarkable. IMPRESSION: No active disease. Electronically Signed   By: Inez Catalina M.D.   On: 03/14/2019 10:20    EKG: Independently reviewed. Sinus rhythm at 97 bpm  Assessment/Plan Neutropenic fever, sinusitis: Acute.  Patient presents with complaints of fever and confusion.  Based off initial complaints it was concern for an .  Urinalysis and chest x-ray did not show any signs for infection.  CSF was clear without significant abnormality noted.  CT scan of the brain noted for sinus disease.  Given patient is immunocompromised  on methotrexate question possibility of underlying opportunistic infection or possibly that symptoms are secondary to methotrexate. -Admit to a telemetry bed  -Follow-up blood cultures and CSF cultures -Added on cryptococcal antigen to CSF -Follow-up pathology smear -Check RPR and HIV   -Check respiratory virus panel -Continue empiric antibiotics of vancomycin and Rocephin -Tylenol as needed for fever -May warrant consult to ID  Acute encephalopathy: Patient reports being more confused with expressive aphasia.  Neurology question the possibility of methotrexate induced PRES. -Neurochecks  -Add on methotrexate level, TSH, and ammonia level -Check EEG and MRI of the brain without contrast -May warrant formal consultation to neurology in a.m.  Acute kidney injury: Baseline creatinine previously noted to be within normal limits.  Patient presents with creatinine 1.26 with BUN 13. -Normal saline IV fluids at 100 mL/h overnight -Recheck kidney function in a.m.  Atopic dermatitis, on methotrexate therapy: Patient followed in outpatient setting by jospeh Jorizzo at [facility].  Recently started on methotrexate 4 weeks ago with his last dose on 5/18.  Question if symptoms may be related.  -Hold methotrexate   Hypokalemia: Cute.  Potassium 3.2 on admission. -Give 40 mEq of potassium chloride x1 dose now -Monitor and replace as needed  Thrombocytopenia: Chronic.  Platelet count 137.  No reports of bleeding.  Patient was also followed by Dr. Alen Blew for leukopenia and was reported to need a bone marrow biopsy for further evaluation. -May want to notify Dr. Alen Blew of patient's admission in a.m.   DVT prophylaxis: lovenox Code Status: Full  Family Communication: Discussed plan of care with patient's wife over the phone Disposition Plan: Possible discharge home in 2 to 3 days Consults called: None Admission status: Inpatient  Norval Morton MD [facility] Pager [phone]   If 7PM-7AM,  please contact night-coverage [email or web address] Password [email or web address]  03/14/2019, 2:30 PM

## 2019-03-14 NOTE — Telephone Encounter (Signed)
Pt seen in ED 

## 2019-03-14 NOTE — ED Notes (Signed)
Radiology at bedside for CXR

## 2019-03-14 NOTE — Progress Notes (Signed)
EEG Complete  Results Pending 

## 2019-03-14 NOTE — ED Notes (Signed)
Pt still not able to provide urine sample, will attempt again in a little while

## 2019-03-14 NOTE — ED Provider Notes (Signed)
[facility] EMERGENCY DEPARTMENT Provider Note   CSN: [id number] Arrival date & time: 03/14/19  9741    History   Chief Complaint Chief Complaint  Patient presents with  . Fever  . Dizziness  . Generalized Body Aches    HPI Leighton Rodney. is a 33 y.o. male.     HPI 33 year old male with no significant past medical history presents with fever, lightheadedness, and body aches.  Patient states that he has not felt well the past 3 days and had to leave work early on Monday.  He has had ongoing fatigue since that time and this morning was more confused.  Wife was concerned and brought him to the ED for evaluation.  Patient has had mild headache that is located "behind his eyes".  No vision changes, flashers, or floaters.  Denies nausea or vomiting.  He has had some anterior neck pain.  No difficulty swallowing.  Denies abdominal pain or urinary symptoms.  No cough, chest pain, or shortness of breath.  He has had muscle aches.  Denies extremity weakness or numbness.  No wounds or rashes.  Past Medical History:  Diagnosis Date  . Chicken pox   . Migraines     There are no active problems to display for this patient.   Past Surgical History:  Procedure Laterality Date  . TONSILLECTOMY  03/2017        Home Medications    Prior to Admission medications   Medication Sig Start Date End Date Taking? Authorizing Provider  folic acid (FOLVITE) 1 MG tablet Take 1 mg by mouth daily. 02/18/19  Yes [provider]  methotrexate (RHEUMATREX) 2.5 MG tablet Take 7.5 mg by mouth See admin instructions. Take on Sunday and Monday in the evening 02/18/19  Yes [provider]    Family History Family History  Problem Relation Age of Onset  . Hypertension Mother   . Breast cancer Mother   . Diabetes Mellitus II Mother   . Hyperlipidemia Father        20 13  . Heart attack Father   . Colon cancer Maternal Grandmother        Diagnosed in 1994   .  Heart attack Paternal Grandmother   . Esophageal cancer Paternal Grandfather     Social History Social History   Tobacco Use  . Smoking status: Never Smoker  . Smokeless tobacco: Never Used  Substance Use Topics  . Alcohol use: Yes    Frequency: Never    Comment: occ  . Drug use: No     Allergies   Other   Review of Systems Review of Systems  Constitutional: Positive for fever. Negative for chills.  HENT: Negative for ear pain and sore throat.   Eyes: Negative for pain and visual disturbance.  Respiratory: Negative for cough and shortness of breath.   Cardiovascular: Negative for chest pain and palpitations.  Gastrointestinal: Negative for abdominal pain and vomiting.  Genitourinary: Negative for dysuria and hematuria.  Musculoskeletal: Negative for arthralgias and back pain.  Skin: Negative for color change and rash.  Neurological: Positive for light-headedness. Negative for seizures and syncope.  All other systems reviewed and are negative.    Physical Exam Updated Vital Signs BP 104/65   Pulse 90   Temp 99.7 F (37.6 C) (Oral)   Resp 17   Ht 5\' 11"  (1.803 m)   Wt 93 kg   SpO2 100%   BMI 28.59 kg/m   Physical Exam Vitals  signs and nursing note reviewed.  Constitutional:      Appearance: He is well-developed. He is ill-appearing.  HENT:     Head: Normocephalic and atraumatic.     Nose: Nose normal.     Mouth/Throat:     Mouth: Mucous membranes are moist.     Pharynx: No oropharyngeal exudate or posterior oropharyngeal erythema.  Eyes:     Conjunctiva/sclera: Conjunctivae normal.  Neck:     Musculoskeletal: Full passive range of motion without pain, normal range of motion and neck supple.  Cardiovascular:     Rate and Rhythm: Normal rate and regular rhythm.     Heart sounds: No murmur.  Pulmonary:     Effort: Pulmonary effort is normal. No respiratory distress.     Breath sounds: Normal breath sounds.  Abdominal:     Palpations: Abdomen is soft.      Tenderness: There is no abdominal tenderness.  Lymphadenopathy:     Cervical: No cervical adenopathy.  Skin:    General: Skin is warm and dry.  Neurological:     Mental Status: He is alert.     Comments:  Mental Status:  Orientation: Alert and oriented to person, place, and time.  Memory: Cooperative, follows commands well. Recent and remote memory normal.  Attention, Concentration: Attention span and concentration are normal.  Language: Speech is clear he has word finding difficulty. Fund of Knowledge: Aware of current events, vocabulary appropriate for patient age.  Cranial Nerves   II Visual Fields:  Intact to confrontation III, IV, VI:  Pupils equal and reactive to light and near. Full eye movement without nystagmus  V Facial Sensation:  Normal. No weakness of masticatory muscles  VII:  No facial weakness or asymmetry  VIII Auditory Acuity:  Grossly normal  IX/X:  The uvula is midline; the palate elevates symmetrically  XI:  Normal sternocleidomastoid and trapezius strength  XII:  The tongue is midline. No atrophy or fasciculations.    Motor System:  Muscle Strength: 5/5 and symmetric in the upper and lower extremities. No pronation or drift.  Muscle Tone: Tone and muscle bulk are normal in the upper and lower extremities.   Coordination:  Intact finger-to-nose, heel-to-shin, and rapid alternating movements. No tremor.  Sensation:  Intact to light touch, vibration, and pinprick.  Gait:  Deferred       ED Treatments / Results  Labs (all labs ordered are listed, but only abnormal results are displayed) Labs Reviewed  COMPREHENSIVE METABOLIC PANEL - Abnormal; Notable for the following components:      Result Value   Potassium 3.2 (*)    Glucose, Bld 119 (*)    Creatinine, Ser 1.26 (*)    AST 55 (*)    Alkaline Phosphatase 33 (*)    Total Bilirubin 1.3 (*)    All other components within normal limits  CBC WITH DIFFERENTIAL/PLATELET - Abnormal; Notable for the  following components:   WBC 1.2 (*)    Hemoglobin 11.8 (*)    HCT 37.2 (*)    RDW 16.6 (*)    Platelets 137 (*)    Neutro Abs 0.9 (*)    Lymphs Abs 0.2 (*)    nRBC 4 (*)    All other components within normal limits  URINALYSIS, ROUTINE W REFLEX MICROSCOPIC - Abnormal; Notable for the following components:   Hgb urine dipstick MODERATE (*)    Protein, ur 30 (*)    All other components within normal limits  POCT I-STAT EG7 -  Abnormal; Notable for the following components:   pH, Ven 7.462 (*)    pCO2, Ven 30.4 (*)    pO2, Ven 66.0 (*)    Potassium 3.0 (*)    Calcium, Ion 1.09 (*)    HCT 32.0 (*)    Hemoglobin 10.9 (*)    All other components within normal limits  SARS CORONAVIRUS 2 (HOSPITAL ORDER, PERFORMED IN [facility])  CULTURE, BLOOD (ROUTINE X 2)  CULTURE, BLOOD (ROUTINE X 2)  URINE CULTURE  CSF CULTURE  GRAM STAIN  CULTURE, FUNGUS WITHOUT SMEAR  HSV 1/2 AB IGG/IGM CSF  LACTIC ACID, PLASMA  LIPASE, BLOOD  PROCALCITONIN  PROTIME-INR  BLOOD GAS, VENOUS  CSF CELL COUNT WITH DIFFERENTIAL  CSF CELL COUNT WITH DIFFERENTIAL  PROTEIN AND GLUCOSE, CSF  PATHOLOGIST SMEAR REVIEW    EKG None  Radiology Ct Head Wo Contrast  Result Date: 03/14/2019 CLINICAL DATA:  Confusion EXAM: CT HEAD WITHOUT CONTRAST TECHNIQUE: Contiguous axial images were obtained from the base of the skull through the vertex without intravenous contrast. COMPARISON:  None. FINDINGS: Brain: The ventricles are normal in size and configuration. There is no intracranial mass, hemorrhage, extra-axial fluid collection, or midline shift. The brain parenchyma appears unremarkable. No acute infarct evident. Vascular: There is no appreciable hyperdense vessel. There is no appreciable vascular calcification. Skull: Bony calvarium appears intact. Sinuses/Orbits: There is mucosal thickening in maxillary antra bilaterally, more severe on the right than on the left. There is mucosal thickening and  opacification in multiple ethmoid air cells. There is slight mucosal thickening in the anterior sphenoid sinus regions. Orbits bilaterally appear unremarkable and symmetric. Other: Mastoid air cells are clear. IMPRESSION: Multifocal paranasal sinus disease.  Study otherwise unremarkable. Electronically Signed   By: Bretta Bang III M.D.   On: 03/14/2019 11:08   Dg Chest Port 1 View  Result Date: 03/14/2019 CLINICAL DATA:  Fevers and headaches EXAM: PORTABLE CHEST 1 VIEW COMPARISON:  01/29/2018 FINDINGS: The heart size and mediastinal contours are within normal limits. Both lungs are clear. The visualized skeletal structures are unremarkable. IMPRESSION: No active disease. Electronically Signed   By: Alcide Clever M.D.   On: 03/14/2019 10:20    Procedures .Lumbar Puncture Date/Time: 03/14/2019 1:14 PM Performed by: Vallery Ridge, MD Authorized by: Vallery Ridge, MD   Consent:    Consent obtained:  Written   Consent given by:  Patient   Risks discussed:  Bleeding, headache, nerve damage, infection, pain and repeat procedure   Alternatives discussed:  No treatment and delayed treatment Pre-procedure details:    Procedure purpose:  Diagnostic   Preparation: Patient was prepped and draped in usual sterile fashion   Anesthesia (see MAR for exact dosages):    Anesthesia method:  Local infiltration   Local anesthetic:  Lidocaine 2% WITH epi Procedure details:    Lumbar space:  L3-L4 interspace   Patient position:  Sitting   Needle gauge:  22   Ultrasound guidance: no     Number of attempts:  1   Fluid appearance:  Clear   Tubes of fluid:  4   Total volume (ml):  13 Post-procedure:    Puncture site:  Adhesive bandage applied   Patient tolerance of procedure:  Tolerated well, no immediate complications   (including critical care time)  Medications Ordered in ED Medications  acetaminophen (TYLENOL) tablet 650 mg (650 mg Oral Given 03/14/19 1009)  vancomycin (VANCOCIN) 2,000 mg in  sodium chloride 0.9 % 500 mL IVPB (2,000 mg  Intravenous New Bag/Given 03/14/19 1149)  cefTRIAXone (ROCEPHIN) 2 g in sodium chloride 0.9 % 100 mL IVPB (0 g Intravenous Stopped 03/14/19 1040)  sodium chloride 0.9 % bolus 500 mL (0 mLs Intravenous Stopped 03/14/19 1053)  acetaminophen (TYLENOL) tablet 325 mg (325 mg Oral Given 03/14/19 1027)  lactated ringers bolus 1,000 mL (0 mLs Intravenous Stopped 03/14/19 1203)  lidocaine-EPINEPHrine (XYLOCAINE W/EPI) 2 %-1:200000 (PF) injection 10 mL (10 mLs Intradermal Given 03/14/19 1258)     Initial Impression / Assessment and Plan / ED Course  I have reviewed the triage vital signs and the nursing notes.  Pertinent labs & imaging results that were available during my care of the patient were reviewed by me and considered in my medical decision making (see chart for details).  33 year old male with no significant past medical history presents with fever, lightheadedness, and body aches.  Patient is febrile to 103 on arrival.  Tachycardic to 100. Normotensive.   Code sepsis initiated.  Blood and urine cultures obtained.  Vancomycin and ceftriaxone given.  Differential includes pneumonia, COVID, UTI, meningitis.  Considered meningitis less likely as he has no meningismus.  On exam, he has 5 out of 5 strength in bilateral upper and lower extremities.  Patient is having word finding difficulty but does not have dysarthria or other focal findings on exam to suggest stroke.  Upon chart review, patient does have a history of eosinophilia as well as mild leuko-cytopenia.  Eosinophilia was felt to be reactive to atopic dermatitis and possible eczema although a myeloproliferative disorder cannot be definitively ruled out.  Lactic acid 1.3.  WBC 1.2. Hbg 11.8. Plts 137.  INR 1.2.  Lipase 38.  SARS-CoV-2 negative.  CT head shows multifocal paranasal sinus disease.  Study otherwise unremarkable.  Lumbar puncture performed successfully.  0 RBC, 0 WBC, 59 glucose, 29  protein.  Not consistent with meningitis.  Patient admitted for further management of neutropenic fever and altered mental status.   Final Clinical Impressions(s) / ED Diagnoses   Final diagnoses:  None    ED Discharge Orders    None       Vallery Ridge, MD 03/14/19 1454    Blane Ohara, MD 03/14/19 1529

## 2019-03-14 NOTE — Telephone Encounter (Signed)
Phone call from pt's wife.  Reported onset of fever of 100.2 during the night.  Stated the pt. Has not been feeling like himself since Monday, when he left work early. Reported he is very tired; c/o weakness; temp. at 7:00 AM 101.3.  Stated he was trying to speak to her about a dream he had, and had difficulty finding the words.  Reported c/o dizziness this morning.  Reported c/o of headache intermittently, but stated that is not unusual for him.  Denied c/o chest pain or shortness of breath.  Denied cough.  Spoke with pt. to ask what his worst symptom was this morning, and he had difficulty finding the words.  Denied any numbness/ tingling or weakness of extremities.  Stated he is dizzy when trying to get up out of bed.  Due to the loss of speech, advised he should be evaluated at the ER.  Questioned wife if needs to call 911, as pt. should be evaluated quickly.  Stated she is able to take him to the ER now.  Advised to proceed there immediately, and to drive safely.  Verb. Understanding.   Reason for Disposition . [1] Loss of speech or garbled speech AND [2] sudden onset AND [3] brief (now gone)    Difficulty finding words in answering questions.  Clear speech noted in words he is able to speak.  Answer Assessment - Initial Assessment Questions 1. SYMPTOM: "What is the main symptom you are concerned about?" (e.g., weakness, numbness)     Fever, weakness, difficulty completing sentences,  2. ONSET: "When did this start?" (minutes, hours, days; while sleeping)     Change in ability to communicate,  3. LAST NORMAL: "When was the last time you were normal (no symptoms)?"     Hasn't felt like himself since Monday 4. PATTERN "Does this come and go, or has it been constant since it started?"  "Is it present now?"    intermittent 5. CARDIAC SYMPTOMS: "Have you had any of the following symptoms: chest pain, difficulty breathing, palpitations?"     No chest pain; no shortness of breath  6. NEUROLOGIC  SYMPTOMS: "Have you had any of the following symptoms: headache, dizziness, vision loss, double vision, changes in speech, unsteady on your feet?"     C/o dizziness with sitting up; intermittent headache; speaking in incomplete sentences; "difficulty finding the words" 7. OTHER SYMPTOMS: "Do you have any other symptoms?"     More weak and tired; able to walk but very slowly; right side lymph node swollen 8. PREGNANCY: "Is there any chance you are pregnant?" "When was your last menstrual period?"     n/a  Protocols used: NEUROLOGIC DEFICIT-A-AH

## 2019-03-15 DIAGNOSIS — R509 Fever, unspecified: Secondary | ICD-10-CM

## 2019-03-15 DIAGNOSIS — J019 Acute sinusitis, unspecified: Secondary | ICD-10-CM

## 2019-03-15 DIAGNOSIS — G9340 Encephalopathy, unspecified: Secondary | ICD-10-CM

## 2019-03-15 LAB — COMPREHENSIVE METABOLIC PANEL
ALT: 33 U/L (ref 0–44)
AST: 40 U/L (ref 15–41)
Albumin: 3.4 g/dL — ABNORMAL LOW (ref 3.5–5.0)
Alkaline Phosphatase: 27 U/L — ABNORMAL LOW (ref 38–126)
Anion gap: 10 (ref 5–15)
BUN: 11 mg/dL (ref 6–20)
CO2: 23 mmol/L (ref 22–32)
Calcium: 8.9 mg/dL (ref 8.9–10.3)
Chloride: 110 mmol/L (ref 98–111)
Creatinine, Ser: 0.93 mg/dL (ref 0.61–1.24)
GFR calc Af Amer: >60 mL/min (ref >60)
GFR calc non Af Amer: >60 mL/min (ref >60)
Glucose, Bld: 165 mg/dL — ABNORMAL HIGH (ref 70–99)
Potassium: 3.9 mmol/L (ref 3.5–5.1)
Sodium: 143 mmol/L (ref 135–145)
Total Bilirubin: 0.7 mg/dL (ref 0.3–1.2)
Total Protein: 6.9 g/dL (ref 6.5–8.1)

## 2019-03-15 LAB — CBC
HCT: 35.3 % — ABNORMAL LOW (ref 39.0–52.0)
Hemoglobin: 11.3 g/dL — ABNORMAL LOW (ref 13.0–17.0)
MCH: 26.8 pg (ref 26.0–34.0)
MCHC: 32 g/dL (ref 30.0–36.0)
MCV: 83.6 fL (ref 80.0–100.0)
Platelets: 120 10*3/uL — ABNORMAL LOW (ref 150–400)
RBC: 4.22 MIL/uL (ref 4.22–5.81)
RDW: 16.8 % — ABNORMAL HIGH (ref 11.5–15.5)
WBC: 1.5 10*3/uL — ABNORMAL LOW (ref 4.0–10.5)
nRBC: 0 % (ref 0.0–0.2)

## 2019-03-15 LAB — URINE CULTURE: Culture: NO GROWTH

## 2019-03-15 LAB — RPR: RPR Ser Ql: NONREACTIVE

## 2019-03-15 LAB — PATHOLOGIST SMEAR REVIEW

## 2019-03-15 MED ORDER — SODIUM CHLORIDE 0.9 % IV SOLN
3.0000 g | Freq: Four times a day (QID) | INTRAVENOUS | Status: DC
  Administered 2019-03-15 – 2019-03-16 (×4): 3 g via INTRAVENOUS
  Filled 2019-03-15 (×7): qty 3

## 2019-03-15 NOTE — Progress Notes (Signed)
Pharmacy Antibiotic Note  Joel Wright. is a 33 y.o. male admitted on 03/14/2019 with original concern for meningitis however now found to have sinusitis. Pharmacy has been consulted to transition to Unasyn.   Plan: - Start Unasyn 3g IV every 6 hours - Will continue to follow renal function, culture results, LOT, and antibiotic de-escalation plans   Height: 5\' 11"  (180.3 cm) Weight: 205 lb (93 kg) IBW/kg (Calculated) : 75.3  Temp (24hrs), Avg:99.5 F (37.5 C), Min:97.9 F (36.6 C), Max:102.3 F (39.1 C)  Recent Labs  Lab 03/14/19 0940 03/15/19 0242  WBC 1.2* 1.5*  CREATININE 1.26* 0.93  LATICACIDVEN 1.3  --     Estimated Creatinine Clearance: 130.4 mL/min (by C-G formula based on SCr of 0.93 mg/dL).    Allergies  Allergen Reactions  . Other Hives    Oranges    Antimicrobials this admission: Vancomycin 5/21 >> 5/22 Ceftriaxone 5/21 >> 5/22 Unasyn 5/22 >>  Microbiology results: 5/21 CSF: ng<24h 5/21 UCx: NGf 5/21 BCx: ngtd 5/21 COIVD: negative  Thank you for allowing pharmacy to be a part of this patient's care.  Georgina Pillion, PharmD, BCPS Clinical Pharmacist Clinical phone for 03/15/2019: [phone] 03/15/2019 12:37 PM   **Pharmacist phone directory can now be found on [email or web address] ([email or web address]).  Listed under [facility].

## 2019-03-15 NOTE — Progress Notes (Signed)
Patient ID: Joel Wright., male   DOB: [birth year], 33 y.o.   MRN: [id number]  PROGRESS NOTE    Joel Wright [facility].  WUJ:[id number] DOB: 28-Oct-1985 DOA: 03/14/2019 PCP: Shirline Frees, NP   Brief Narrative:  33 year old male with history of atopic dermatitis, leukopenia, eosinophilia, immunocompromised taking methotrexate presented on 03/14/2019 with fever and malaise over 3 days prior to presentation.  He was found to be febrile up to 103.2 F in the ED along with pancytopenia.  She also was having episodes of confusion and word finding difficulties.  CT scan of the brain showed multifocal paranasal sinus disease.  He was started on broad-spectrum antibiotics.  LP was done.  Assessment & Plan:   Principal Problem:   Neutropenic fever (HCC) Active Problems:   AKI (acute kidney injury) (HCC)   On methotrexate therapy   Thrombocytopenia (HCC)   Acute encephalopathy  Fever: Questionable cause Sinusitis Pancytopenia Acute metabolic encephalopathy -Questionable cause.  Work-up revealed multifocal paranasal sinus disease.  MRI of the brain was negative for abnormality except for sinus disease.  Chest x-ray was negative for any acute infiltrate.  He is currently on vancomycin and Rocephin.  Will switch to Unasyn alone.  -LP on presentation showed 0 WBCs hence meningitis was ruled out.  CSF cryptococcal antigen was negative.  Blood, urine and CSF cultures are pending. -COVID-19 rapid testing negative on presentation -RPR nonreactive -Currently afebrile. -Still pancytopenic but this might be secondary to recent methotrexate use.  Will need outpatient hematology follow-up with Dr. Juliette Alcide -EEG was negative for acute abnormality  Acute kidney injury -Improved with IV fluids.  Atopic dermatitis, on methotrexate therapy -Outpatient follow-up at [facility].  Recently started on methotrexate 4 weeks ago with his last dose on 03/11/2019. -Hold methotrexate  Hypokalemia -Improved    DVT prophylaxis: Lovenox Code Status: Full  family Communication: None at bedside Disposition Plan: Home in 1 to 2 days if clinically improved  Consultants: None  Procedures: LP on 03/14/2019 EEG on 03/14/2019: Normal  Antimicrobials:  Vancomycin and Rocephin from 03/14/2019 onwards  Subjective: Patient seen and examined at bedside.  He feels much better.  Denies current headache.  He feels that his speech is getting better.  No overnight fever or vomiting.  Still feels weak.  Appetite slightly improving.  Objective: Vitals:   03/14/19 2208 03/15/19 0050 03/15/19 0413 03/15/19 0953  BP: 108/67 117/80 120/83 110/77  Pulse: 71 62 (!) 55 62  Resp:    18  Temp: 98.2 F (36.8 C) 97.9 F (36.6 C) 98 F (36.7 C) 97.9 F (36.6 C)  TempSrc: Oral Oral Oral   SpO2: 96% 100% 99% 99%  Weight:      Height:        Intake/Output Summary (Last 24 hours) at 03/15/2019 1209 Last data filed at 03/15/2019 0830 Gross per 24 hour  Intake 2231.2 ml  Output -  Net 2231.2 ml   Filed Weights   03/14/19 0928  Weight: 93 kg    Examination:  General exam: Appears calm and comfortable  Respiratory system: Bilateral decreased breath sounds at bases Cardiovascular system: S1 & S2 heard, intermittently bradycardic Gastrointestinal system: Abdomen is nondistended, soft and nontender. Normal bowel sounds heard. Extremities: No cyanosis, clubbing, edema     Data Reviewed: I have personally reviewed following labs and imaging studies  CBC: Recent Labs  Lab 03/14/19 0940 03/14/19 1056 03/15/19 0242  WBC 1.2*  --  1.5*  NEUTROABS 0.9*  --   --  HGB 11.8* 10.9* 11.3*  HCT 37.2* 32.0* 35.3*  MCV 84.7  --  83.6  PLT 137*  --  120*   Basic Metabolic Panel: Recent Labs  Lab 03/14/19 0940 03/14/19 1056 03/15/19 0242  NA 138 139 143  K 3.2* 3.0* 3.9  CL 104  --  110  CO2 23  --  23  GLUCOSE 119*  --  165*  BUN 13  --  11  CREATININE 1.26*  --  0.93  CALCIUM 9.4  --  8.9   GFR:  Estimated Creatinine Clearance: 130.4 mL/min (by C-G formula based on SCr of 0.93 mg/dL). Liver Function Tests: Recent Labs  Lab 03/14/19 0940 03/15/19 0242  AST 55* 40  ALT 41 33  ALKPHOS 33* 27*  BILITOT 1.3* 0.7  PROT 7.5 6.9  ALBUMIN 4.0 3.4*   Recent Labs  Lab 03/14/19 0940  LIPASE 38   Recent Labs  Lab 03/14/19 1606  AMMONIA 21   Coagulation Profile: Recent Labs  Lab 03/14/19 1005  INR 1.2   Cardiac Enzymes: No results for input(s): CKTOTAL, CKMB, CKMBINDEX, TROPONINI in the last 168 hours. BNP (last 3 results) No results for input(s): PROBNP in the last 8760 hours. HbA1C: No results for input(s): HGBA1C in the last 72 hours. CBG: No results for input(s): GLUCAP in the last 168 hours. Lipid Profile: No results for input(s): CHOL, HDL, LDLCALC, TRIG, CHOLHDL, LDLDIRECT in the last 72 hours. Thyroid Function Tests: Recent Labs    03/14/19 1606  TSH 0.433   Anemia Panel: No results for input(s): VITAMINB12, FOLATE, FERRITIN, TIBC, IRON, RETICCTPCT in the last 72 hours. Sepsis Labs: Recent Labs  Lab 03/14/19 0940  PROCALCITON 1.26  LATICACIDVEN 1.3    Recent Results (from the past 240 hour(s))  Blood Culture (routine x 2)     Status: None (Preliminary result)   Collection Time: 03/14/19  9:49 AM  Result Value Ref Range Status   Specimen Description BLOOD RIGHT ANTECUBITAL  Final   Special Requests   Final    BOTTLES DRAWN AEROBIC AND ANAEROBIC Blood Culture adequate volume   Culture   Final    NO GROWTH 1 DAY Performed at [facility], [address] [place], [place] [address]    Report Status PENDING  Incomplete  SARS Coronavirus 2 (CEPHEID- Performed in [facility] hospital lab), Hosp Order     Status: None   Collection Time: 03/14/19 10:01 AM  Result Value Ref Range Status   SARS Coronavirus 2 NEGATIVE NEGATIVE Final    Comment: (NOTE) If result is NEGATIVE SARS-CoV-2 target nucleic acids are NOT DETECTED. The SARS-CoV-2 RNA is  generally detectable in upper and lower  respiratory specimens during the acute phase of infection. The lowest  concentration of SARS-CoV-2 viral copies this assay can detect is 250  copies / mL. A negative result does not preclude SARS-CoV-2 infection  and should not be used as the sole basis for treatment or other  patient management decisions.  A negative result may occur with  improper specimen collection / handling, submission of specimen other  than nasopharyngeal swab, presence of viral mutation(s) within the  areas targeted by this assay, and inadequate number of viral copies  (<250 copies / mL). A negative result must be combined with clinical  observations, patient history, and epidemiological information. If result is POSITIVE SARS-CoV-2 target nucleic acids are DETECTED. The SARS-CoV-2 RNA is generally detectable in upper and lower  respiratory specimens dur ing the acute  phase of infection.  Positive  results are indicative of active infection with SARS-CoV-2.  Clinical  correlation with patient history and other diagnostic information is  necessary to determine patient infection status.  Positive results do  not rule out bacterial infection or co-infection with other viruses. If result is PRESUMPTIVE POSTIVE SARS-CoV-2 nucleic acids MAY BE PRESENT.   A presumptive positive result was obtained on the submitted specimen  and confirmed on repeat testing.  While 2019 novel coronavirus  (SARS-CoV-2) nucleic acids may be present in the submitted sample  additional confirmatory testing may be necessary for epidemiological  and / or clinical management purposes  to differentiate between  SARS-CoV-2 and other Sarbecovirus currently known to infect humans.  If clinically indicated additional testing with an alternate test  methodology [phone]) is advised. The SARS-CoV-2 RNA is generally  detectable in upper and lower respiratory sp ecimens during the acute  phase of infection.  The expected result is Negative. Fact Sheet for Patients:  BoilerBrush.com.cy Fact Sheet for Healthcare Providers: https://pope.com/ This test is not yet approved or cleared by the [place] FDA and has been authorized for detection and/or diagnosis of SARS-CoV-2 by FDA under an Emergency Use Authorization (EUA).  This EUA will remain in effect (meaning this test can be used) for the duration of the COVID-19 declaration under Section 564(b)(1) of the Act, 21 U.S.C. section 360bbb-3(b)(1), unless the authorization is terminated or revoked sooner. Performed at [facility], [address] [place], [place] [address]   Blood Culture (routine x 2)     Status: None (Preliminary result)   Collection Time: 03/14/19 10:04 AM  Result Value Ref Range Status   Specimen Description BLOOD LEFT HAND  Final   Special Requests   Final    BOTTLES DRAWN AEROBIC ONLY Blood Culture results may not be optimal due to an inadequate volume of blood received in culture bottles   Culture   Final    NO GROWTH 1 DAY Performed at [facility], [address] [place], [place] [address]    Report Status PENDING  Incomplete  Urine culture     Status: None   Collection Time: 03/14/19 12:14 PM  Result Value Ref Range Status   Specimen Description URINE, CLEAN CATCH  Final   Special Requests NONE  Final   Culture   Final    NO GROWTH Performed at [facility], [address], [place], [place] [address]    Report Status 03/15/2019 FINAL  Final  CSF culture     Status: None (Preliminary result)   Collection Time: 03/14/19  1:13 PM  Result Value Ref Range Status   Specimen Description CSF  Final   Special Requests Immunocompromised  Final   Gram Stain CYTOSPIN SMEAR NO WBC SEEN NO ORGANISMS SEEN   Final   Culture   Final    NO GROWTH < 24 HOURS Performed at [facility], [address] [place], [place] [address]    Report Status  PENDING  Incomplete         Radiology Studies: Ct Head Wo Contrast  Result Date: 03/14/2019 CLINICAL DATA:  Confusion EXAM: CT HEAD WITHOUT CONTRAST TECHNIQUE: Contiguous axial images were obtained from the base of the skull through the vertex without intravenous contrast. COMPARISON:  None. FINDINGS: Brain: The ventricles are normal in size and configuration. There is no intracranial mass, hemorrhage, extra-axial fluid collection, or midline shift. The brain parenchyma appears unremarkable. No acute  infarct evident. Vascular: There is no appreciable hyperdense vessel. There is no appreciable vascular calcification. Skull: Bony calvarium appears intact. Sinuses/Orbits: There is mucosal thickening in maxillary antra bilaterally, more severe on the right than on the left. There is mucosal thickening and opacification in multiple ethmoid air cells. There is slight mucosal thickening in the anterior sphenoid sinus regions. Orbits bilaterally appear unremarkable and symmetric. Other: Mastoid air cells are clear. IMPRESSION: Multifocal paranasal sinus disease.  Study otherwise unremarkable. Electronically Signed   By: Bretta Bang III M.D.   On: 03/14/2019 11:08   Mr Brain Wo Contrast  Result Date: 03/14/2019 CLINICAL DATA:  33 y/o M; 3 days of fever and generalized malaise. Immunocompromised patient on methotrexate. Dizziness, headache, neck discomfort, confusion, speech difficulty. EXAM: MRI HEAD WITHOUT CONTRAST TECHNIQUE: Multiplanar, multiecho pulse sequences of the brain and surrounding structures were obtained without intravenous contrast. COMPARISON:  03/14/2019 CT of the head. FINDINGS: Brain: No acute infarction, hemorrhage, hydrocephalus, extra-axial collection or mass lesion. No structural or signal abnormality of the brain identified. Vascular: Normal flow voids. Skull and upper cervical spine: Normal marrow signal. Sinuses/Orbits: Mild diffuse paranasal sinus mucosal thickening. Partial  opacification of posterior left mastoid air cells. Additional mastoid air cells demonstrate normal signal. Orbits are unremarkable. Other: None. IMPRESSION: 1. No acute intracranial abnormality. Unremarkable MRI of the brain. 2. Mild diffuse paranasal sinus mucosal thickening and partial opacification of left mastoid air cells. Electronically Signed   By: Mitzi Hansen M.D.   On: 03/14/2019 21:54   Dg Chest Port 1 View  Result Date: 03/14/2019 CLINICAL DATA:  Fevers and headaches EXAM: PORTABLE CHEST 1 VIEW COMPARISON:  01/29/2018 FINDINGS: The heart size and mediastinal contours are within normal limits. Both lungs are clear. The visualized skeletal structures are unremarkable. IMPRESSION: No active disease. Electronically Signed   By: Alcide Clever M.D.   On: 03/14/2019 10:20        Scheduled Meds: . enoxaparin (LOVENOX) injection  40 mg Subcutaneous Q24H  . folic acid  1 mg Oral Daily  . sodium chloride flush  3 mL Intravenous Q12H   Continuous Infusions: . sodium chloride 125 mL/hr at 03/15/19 0354  . cefTRIAXone (ROCEPHIN)  IV 2 g (03/15/19 0941)  . vancomycin 1,250 mg (03/15/19 0357)     LOS: 1 day        Glade Lloyd, MD [facility] 03/15/2019, 12:09 PM

## 2019-03-15 NOTE — Procedures (Signed)
ELECTROENCEPHALOGRAM REPORT   Patient: Joel Wright [facility].       Room #: 5W01C EEG No. ID: [id number] Age: 33 y.o.        Sex: male Referring Physician: Hanley Ben Report Date:  03/15/2019        Interpreting Physician: Thana Farr  History: Joel Behrns. is an 33 y.o. male with altered mental status and fever  Medications:  Rocephin, Folvite, Vancomycin  Conditions of Recording:  This is a 21 channel routine scalp EEG performed with bipolar and monopolar montages arranged in accordance to the international 10/20 system of electrode placement. One channel was dedicated to EKG recording.  The patient is in the awake and drowsy states.  Description:  The waking background activity consists of a low voltage, symmetrical, fairly well organized, 9 Hz alpha activity, seen from the parieto-occipital and posterior temporal regions.  Low voltage fast activity, poorly organized, is seen anteriorly and is at times superimposed on more posterior regions.  A mixture of theta and alpha rhythms are seen from the central and temporal regions. The patient drowses with slowing to irregular, low voltage theta and beta activity.   Stage II sleep is not obtained. No epileptiform activity is noted.   Hyperventilation was not performed. Intermittent photic stimulation was performed but failed to illicit any change in the tracing.   IMPRESSION: Normal electroencephalogram, awake, drowsy and with activation procedures. There are no focal lateralizing or epileptiform features.   Thana Farr, MD Neurology [phone] 03/15/2019, 8:22 AM

## 2019-03-16 DIAGNOSIS — D61818 Other pancytopenia: Principal | ICD-10-CM

## 2019-03-16 LAB — CBC WITH DIFFERENTIAL/PLATELET
Abs Immature Granulocytes: 0.02 10*3/uL (ref 0.00–0.07)
Basophils Absolute: 0 10*3/uL (ref 0.0–0.1)
Basophils Relative: 0 %
Eosinophils Absolute: 0 10*3/uL (ref 0.0–0.5)
Eosinophils Relative: 1 %
HCT: 35.6 % — ABNORMAL LOW (ref 39.0–52.0)
Hemoglobin: 11.4 g/dL — ABNORMAL LOW (ref 13.0–17.0)
Immature Granulocytes: 1 %
Lymphocytes Relative: 22 %
Lymphs Abs: 0.3 10*3/uL — ABNORMAL LOW (ref 0.7–4.0)
MCH: 27.1 pg (ref 26.0–34.0)
MCHC: 32 g/dL (ref 30.0–36.0)
MCV: 84.8 fL (ref 80.0–100.0)
Monocytes Absolute: 0.1 10*3/uL (ref 0.1–1.0)
Monocytes Relative: 9 %
Neutro Abs: 1 10*3/uL — ABNORMAL LOW (ref 1.7–7.7)
Neutrophils Relative %: 67 %
Platelets: 137 10*3/uL — ABNORMAL LOW (ref 150–400)
RBC: 4.2 MIL/uL — ABNORMAL LOW (ref 4.22–5.81)
RDW: 17 % — ABNORMAL HIGH (ref 11.5–15.5)
WBC: 1.5 10*3/uL — ABNORMAL LOW (ref 4.0–10.5)
nRBC: 0 % (ref 0.0–0.2)

## 2019-03-16 LAB — COMPREHENSIVE METABOLIC PANEL
ALT: 32 U/L (ref 0–44)
AST: 34 U/L (ref 15–41)
Albumin: 3.3 g/dL — ABNORMAL LOW (ref 3.5–5.0)
Alkaline Phosphatase: 26 U/L — ABNORMAL LOW (ref 38–126)
Anion gap: 9 (ref 5–15)
BUN: 12 mg/dL (ref 6–20)
CO2: 21 mmol/L — ABNORMAL LOW (ref 22–32)
Calcium: 9.3 mg/dL (ref 8.9–10.3)
Chloride: 113 mmol/L — ABNORMAL HIGH (ref 98–111)
Creatinine, Ser: 0.7 mg/dL (ref 0.61–1.24)
GFR calc Af Amer: >60 mL/min (ref >60)
GFR calc non Af Amer: >60 mL/min (ref >60)
Glucose, Bld: 118 mg/dL — ABNORMAL HIGH (ref 70–99)
Potassium: 3.8 mmol/L (ref 3.5–5.1)
Sodium: 143 mmol/L (ref 135–145)
Total Bilirubin: 0.5 mg/dL (ref 0.3–1.2)
Total Protein: 6.3 g/dL — ABNORMAL LOW (ref 6.5–8.1)

## 2019-03-16 LAB — MAGNESIUM: Magnesium: 2.4 mg/dL (ref 1.7–2.4)

## 2019-03-16 MED ORDER — AMOXICILLIN-POT CLAVULANATE 875-125 MG PO TABS: 1.0000 | ORAL_TABLET | Freq: Two times a day (BID) | ORAL | 0 refills | Status: DC

## 2019-03-16 NOTE — Discharge Summary (Signed)
Physician Discharge Summary  Joel Wright. ZOX:[id number] DOB: 01-Apr-1986 DOA: 03/14/2019  PCP: Shirline Frees, NP  Admit date: 03/14/2019 Discharge date: 03/16/2019  Admitted From: Home Disposition: Home  Recommendations for Outpatient Follow-up:  1. Follow up with PCP in 1 week with repeat CBC/BMP 2. Follow-up with oncology/Dr. Clelia Croft as an outpatient 3. Follow up in ED if symptoms worsen or new appear   Home Health: No Equipment/Devices: None  Discharge Condition: Stable CODE STATUS: Full Diet recommendation: Regular  Brief/Interim Summary: 33 year old male with history of atopic dermatitis, leukopenia, eosinophilia, immunocompromised taking methotrexate presented on 03/14/2019 with fever and malaise over 3 days prior to presentation.  He was found to be febrile up to 103.2 F in the ED along with pancytopenia.  She also was having episodes of confusion and word finding difficulties.  CT scan of the brain showed multifocal paranasal sinus disease.  He was started on broad-spectrum antibiotics.  LP ruled out the possibility of meningitis.  MRI of the brain was negative for acute abnormality except for sinus disease.  Antibiotics were switched to Unasyn.  EEG was negative.  He has remained afebrile.  He will be discharged on oral Augmentin.  Discharge Diagnoses:  Principal Problem:   Neutropenic fever (HCC) Active Problems:   AKI (acute kidney injury) (HCC)   On methotrexate therapy   Thrombocytopenia (HCC)   Acute encephalopathy  Fever: Questionable cause Sinusitis Pancytopenia Acute metabolic encephalopathy -Questionable cause.  Work-up revealed multifocal paranasal sinus disease.  MRI of the brain was negative for abnormality except for sinus disease.  Chest x-ray was negative for any acute infiltrate.    Started on vancomycin and Rocephin and was switched to Unasyn. -LP on presentation showed 0 WBCs hence meningitis was ruled out.  CSF cryptococcal antigen was  negative.  Blood, urine and CSF cultures are negative so far. -COVID-19 rapid testing negative on presentation -RPR nonreactive -Currently afebrile. -Still pancytopenic but this might be secondary to recent methotrexate use as well.  Will need outpatient hematology follow-up with Dr. Clelia Croft.  Spoke to Dr. Clelia Croft on phone on 03/15/2019 and he stated that he would arrange for outpatient follow-up. -EEG was negative for acute abnormality -Patient feels much better and feels that his mental status is back to normal. He wants to go home.  He will be discharged on oral Augmentin for 5 more days.  Acute kidney injury -Improved with IV fluids.  Atopic dermatitis, on methotrexate therapy -Outpatient follow-up at [facility].  Recently started on methotrexate 4 weeks ago with his last dose on 03/11/2019. -Hold methotrexate on discharge.  Hypokalemia -Improved   Discharge Instructions  Discharge Instructions    Ambulatory referral to Oncology   Complete by:  As directed    Diet general   Complete by:  As directed    Increase activity slowly   Complete by:  As directed      Allergies as of 03/16/2019      Reactions   Other Hives   Oranges      Medication List    STOP taking these medications   methotrexate 2.5 MG tablet Commonly known as:  RHEUMATREX     TAKE these medications   amoxicillin-clavulanate 875-125 MG tablet Commonly known as:  Augmentin Take 1 tablet by mouth 2 (two) times daily for 5 days.   folic acid 1 MG tablet Commonly known as:  FOLVITE Take 1 mg by mouth daily.      Follow-up Information    Nafziger, Kandee Keen,  NP. Schedule an appointment as soon as possible for a visit in 1 week(s).   Specialty:  Family Medicine Why:  CBC with BMP Contact information: [address] [place] [address] [phone]        Benjiman Core, MD. Schedule an appointment as soon as possible for a visit in 1 week(s).   Specialty:  Oncology Contact  information: [address] [place] [address] [phone]          Allergies  Allergen Reactions  . Other Hives    Oranges    Consultations:  Spoke to Dr. Clelia Croft on phone on 03/15/2019.   Procedures/Studies: Ct Head Wo Contrast  Result Date: 03/14/2019 CLINICAL DATA:  Confusion EXAM: CT HEAD WITHOUT CONTRAST TECHNIQUE: Contiguous axial images were obtained from the base of the skull through the vertex without intravenous contrast. COMPARISON:  None. FINDINGS: Brain: The ventricles are normal in size and configuration. There is no intracranial mass, hemorrhage, extra-axial fluid collection, or midline shift. The brain parenchyma appears unremarkable. No acute infarct evident. Vascular: There is no appreciable hyperdense vessel. There is no appreciable vascular calcification. Skull: Bony calvarium appears intact. Sinuses/Orbits: There is mucosal thickening in maxillary antra bilaterally, more severe on the right than on the left. There is mucosal thickening and opacification in multiple ethmoid air cells. There is slight mucosal thickening in the anterior sphenoid sinus regions. Orbits bilaterally appear unremarkable and symmetric. Other: Mastoid air cells are clear. IMPRESSION: Multifocal paranasal sinus disease.  Study otherwise unremarkable. Electronically Signed   By: Bretta Bang III M.D.   On: 03/14/2019 11:08   Mr Brain Wo Contrast  Result Date: 03/14/2019 CLINICAL DATA:  33 y/o M; 3 days of fever and generalized malaise. Immunocompromised patient on methotrexate. Dizziness, headache, neck discomfort, confusion, speech difficulty. EXAM: MRI HEAD WITHOUT CONTRAST TECHNIQUE: Multiplanar, multiecho pulse sequences of the brain and surrounding structures were obtained without intravenous contrast. COMPARISON:  03/14/2019 CT of the head. FINDINGS: Brain: No acute infarction, hemorrhage, hydrocephalus, extra-axial collection or mass lesion. No structural or signal  abnormality of the brain identified. Vascular: Normal flow voids. Skull and upper cervical spine: Normal marrow signal. Sinuses/Orbits: Mild diffuse paranasal sinus mucosal thickening. Partial opacification of posterior left mastoid air cells. Additional mastoid air cells demonstrate normal signal. Orbits are unremarkable. Other: None. IMPRESSION: 1. No acute intracranial abnormality. Unremarkable MRI of the brain. 2. Mild diffuse paranasal sinus mucosal thickening and partial opacification of left mastoid air cells. Electronically Signed   By: Mitzi Hansen M.D.   On: 03/14/2019 21:54   Dg Chest Port 1 View  Result Date: 03/14/2019 CLINICAL DATA:  Fevers and headaches EXAM: PORTABLE CHEST 1 VIEW COMPARISON:  01/29/2018 FINDINGS: The heart size and mediastinal contours are within normal limits. Both lungs are clear. The visualized skeletal structures are unremarkable. IMPRESSION: No active disease. Electronically Signed   By: Alcide Clever M.D.   On: 03/14/2019 10:20     LP on 03/14/2019 EEG on 03/14/2019: Normal  Subjective: Patient seen and examined at bedside.  He feels much better.  Denies any overnight fever, nausea or vomiting.  He wants to go home.  Discharge Exam: Vitals:   03/16/19 0427 03/16/19 0613  BP: (!) 166/80 100/70  Pulse: 96 (!) 57  Resp:  16  Temp:  (!) 97.5 F (36.4 C)  SpO2: 98% 100%    General: Pt is alert, awake, not in acute distress Cardiovascular: Mild intermittent bradycardia, S1/S2 + Respiratory: bilateral decreased breath sounds at  bases Abdominal: Soft, NT, ND, bowel sounds + Extremities: no edema, no cyanosis    The results of significant diagnostics from this hospitalization (including imaging, microbiology, ancillary and laboratory) are listed below for reference.     Microbiology: Recent Results (from the past 240 hour(s))  Blood Culture (routine x 2)     Status: None (Preliminary result)   Collection Time: 03/14/19  9:49 AM  Result  Value Ref Range Status   Specimen Description BLOOD RIGHT ANTECUBITAL  Final   Special Requests   Final    BOTTLES DRAWN AEROBIC AND ANAEROBIC Blood Culture adequate volume   Culture   Final    NO GROWTH 1 DAY Performed at [facility], [address], [place], [place] [address]    Report Status PENDING  Incomplete  SARS Coronavirus 2 (CEPHEID- Performed in [facility] hospital lab), Hosp Order     Status: None   Collection Time: 03/14/19 10:01 AM  Result Value Ref Range Status   SARS Coronavirus 2 NEGATIVE NEGATIVE Final    Comment: (NOTE) If result is NEGATIVE SARS-CoV-2 target nucleic acids are NOT DETECTED. The SARS-CoV-2 RNA is generally detectable in upper and lower  respiratory specimens during the acute phase of infection. The lowest  concentration of SARS-CoV-2 viral copies this assay can detect is 250  copies / mL. A negative result does not preclude SARS-CoV-2 infection  and should not be used as the sole basis for treatment or other  patient management decisions.  A negative result may occur with  improper specimen collection / handling, submission of specimen other  than nasopharyngeal swab, presence of viral mutation(s) within the  areas targeted by this assay, and inadequate number of viral copies  (<250 copies / mL). A negative result must be combined with clinical  observations, patient history, and epidemiological information. If result is POSITIVE SARS-CoV-2 target nucleic acids are DETECTED. The SARS-CoV-2 RNA is generally detectable in upper and lower  respiratory specimens dur ing the acute phase of infection.  Positive  results are indicative of active infection with SARS-CoV-2.  Clinical  correlation with patient history and other diagnostic information is  necessary to determine patient infection status.  Positive results do  not rule out bacterial infection or co-infection with other viruses. If result is PRESUMPTIVE POSTIVE SARS-CoV-2 nucleic  acids MAY BE PRESENT.   A presumptive positive result was obtained on the submitted specimen  and confirmed on repeat testing.  While 2019 novel coronavirus  (SARS-CoV-2) nucleic acids may be present in the submitted sample  additional confirmatory testing may be necessary for epidemiological  and / or clinical management purposes  to differentiate between  SARS-CoV-2 and other Sarbecovirus currently known to infect humans.  If clinically indicated additional testing with an alternate test  methodology [phone]) is advised. The SARS-CoV-2 RNA is generally  detectable in upper and lower respiratory sp ecimens during the acute  phase of infection. The expected result is Negative. Fact Sheet for Patients:  BoilerBrush.com.cy Fact Sheet for Healthcare Providers: https://pope.com/ This test is not yet approved or cleared by the [place] FDA and has been authorized for detection and/or diagnosis of SARS-CoV-2 by FDA under an Emergency Use Authorization (EUA).  This EUA will remain in effect (meaning this test can be used) for the duration of the COVID-19 declaration under Section 564(b)(1) of the Act, 21 U.S.C. section 360bbb-3(b)(1), unless the authorization is terminated or revoked sooner. Performed at [facility], [address] [place],   [id number]   Blood Culture (routine x 2)     Status: None (Preliminary result)   Collection Time: 03/14/19 10:04 AM  Result Value Ref Range Status   Specimen Description BLOOD LEFT HAND  Final   Special Requests   Final    BOTTLES DRAWN AEROBIC ONLY Blood Culture results may not be optimal due to an inadequate volume of blood received in culture bottles   Culture   Final    NO GROWTH 1 DAY Performed at [facility], [address] [place], [place] [address]    Report Status PENDING  Incomplete  Urine culture     Status: None   Collection Time: 03/14/19 12:14 PM  Result Value  Ref Range Status   Specimen Description URINE, CLEAN CATCH  Final   Special Requests NONE  Final   Culture   Final    NO GROWTH Performed at [facility], [address] [place], [place] [address]    Report Status 03/15/2019 FINAL  Final  CSF culture     Status: None (Preliminary result)   Collection Time: 03/14/19  1:13 PM  Result Value Ref Range Status   Specimen Description CSF  Final   Special Requests Immunocompromised  Final   Gram Stain CYTOSPIN SMEAR NO WBC SEEN NO ORGANISMS SEEN   Final   Culture   Final    NO GROWTH < 24 HOURS Performed at [facility], [address] [place], [place] [address]    Report Status PENDING  Incomplete  Culture, fungus without smear     Status: None (Preliminary result)   Collection Time: 03/14/19  1:13 PM  Result Value Ref Range Status   Specimen Description CSF  Final   Special Requests Immunocompromised  Final   Culture   Final    NO FUNGUS ISOLATED AFTER 1 DAY Performed at [facility], [address] [place], [place] [address]    Report Status PENDING  Incomplete     Labs: BNP (last 3 results) No results for input(s): BNP in the last 8760 hours. Basic Metabolic Panel: Recent Labs  Lab 03/14/19 0940 03/14/19 1056 03/15/19 0242 03/16/19 0319  NA 138 139 143 143  K 3.2* 3.0* 3.9 3.8  CL 104  --  110 113*  CO2 23  --  23 21*  GLUCOSE 119*  --  165* 118*  BUN 13  --  11 12  CREATININE 1.26*  --  0.93 0.70  CALCIUM 9.4  --  8.9 9.3  MG  --   --   --  2.4   Liver Function Tests: Recent Labs  Lab 03/14/19 0940 03/15/19 0242 03/16/19 0319  AST 55* 40 34  ALT 41 33 32  ALKPHOS 33* 27* 26*  BILITOT 1.3* 0.7 0.5  PROT 7.5 6.9 6.3*  ALBUMIN 4.0 3.4* 3.3*   Recent Labs  Lab 03/14/19 0940  LIPASE 38   Recent Labs  Lab 03/14/19 1606  AMMONIA 21   CBC: Recent Labs  Lab 03/14/19 0940 03/14/19 1056 03/15/19 0242 03/16/19 0319  WBC 1.2*  --  1.5* 1.5*  NEUTROABS 0.9*  --   --  1.0*  HGB  11.8* 10.9* 11.3* 11.4*  HCT 37.2* 32.0* 35.3* 35.6*  MCV 84.7  --  83.6 84.8  PLT 137*  --  120* 137*   Cardiac Enzymes: No results for input(s): CKTOTAL, CKMB, CKMBINDEX, TROPONINI in the last 168 hours. BNP: Invalid input(s): POCBNP CBG:  No results for input(s): GLUCAP in the last 168 hours. D-Dimer No results for input(s): DDIMER in the last 72 hours. Hgb A1c No results for input(s): HGBA1C in the last 72 hours. Lipid Profile No results for input(s): CHOL, HDL, LDLCALC, TRIG, CHOLHDL, LDLDIRECT in the last 72 hours. Thyroid function studies Recent Labs    03/14/19 1606  TSH 0.433   Anemia work up No results for input(s): VITAMINB12, FOLATE, FERRITIN, TIBC, IRON, RETICCTPCT in the last 72 hours. Urinalysis    Component Value Date/Time   COLORURINE YELLOW 03/14/2019 1221   APPEARANCEUR CLEAR 03/14/2019 1221   LABSPEC 1.006 03/14/2019 1221   PHURINE 5.0 03/14/2019 1221   GLUCOSEU NEGATIVE 03/14/2019 1221   HGBUR MODERATE (A) 03/14/2019 1221   BILIRUBINUR NEGATIVE 03/14/2019 1221   BILIRUBINUR n 09/18/2018 1035   KETONESUR NEGATIVE 03/14/2019 1221   PROTEINUR 30 (A) 03/14/2019 1221   UROBILINOGEN 0.2 09/18/2018 1035   NITRITE NEGATIVE 03/14/2019 1221   LEUKOCYTESUR NEGATIVE 03/14/2019 1221   Sepsis Labs Invalid input(s): PROCALCITONIN,  WBC,  LACTICIDVEN Microbiology Recent Results (from the past 240 hour(s))  Blood Culture (routine x 2)     Status: None (Preliminary result)   Collection Time: 03/14/19  9:49 AM  Result Value Ref Range Status   Specimen Description BLOOD RIGHT ANTECUBITAL  Final   Special Requests   Final    BOTTLES DRAWN AEROBIC AND ANAEROBIC Blood Culture adequate volume   Culture   Final    NO GROWTH 1 DAY Performed at [facility], [address] [place], [place] [address]    Report Status PENDING  Incomplete  SARS Coronavirus 2 (CEPHEID- Performed in [facility] hospital lab), Hosp Order     Status: None   Collection Time: 03/14/19  10:01 AM  Result Value Ref Range Status   SARS Coronavirus 2 NEGATIVE NEGATIVE Final    Comment: (NOTE) If result is NEGATIVE SARS-CoV-2 target nucleic acids are NOT DETECTED. The SARS-CoV-2 RNA is generally detectable in upper and lower  respiratory specimens during the acute phase of infection. The lowest  concentration of SARS-CoV-2 viral copies this assay can detect is 250  copies / mL. A negative result does not preclude SARS-CoV-2 infection  and should not be used as the sole basis for treatment or other  patient management decisions.  A negative result may occur with  improper specimen collection / handling, submission of specimen other  than nasopharyngeal swab, presence of viral mutation(s) within the  areas targeted by this assay, and inadequate number of viral copies  (<250 copies / mL). A negative result must be combined with clinical  observations, patient history, and epidemiological information. If result is POSITIVE SARS-CoV-2 target nucleic acids are DETECTED. The SARS-CoV-2 RNA is generally detectable in upper and lower  respiratory specimens dur ing the acute phase of infection.  Positive  results are indicative of active infection with SARS-CoV-2.  Clinical  correlation with patient history and other diagnostic information is  necessary to determine patient infection status.  Positive results do  not rule out bacterial infection or co-infection with other viruses. If result is PRESUMPTIVE POSTIVE SARS-CoV-2 nucleic acids MAY BE PRESENT.   A presumptive positive result was obtained on the submitted specimen  and confirmed on repeat testing.  While 2019 novel coronavirus  (SARS-CoV-2) nucleic acids may be present in the submitted sample  additional confirmatory testing may be necessary for epidemiological  and / or clinical management purposes  to differentiate between  SARS-CoV-2  and other Sarbecovirus currently known to infect humans.  If clinically indicated  additional testing with an alternate test  methodology [phone]) is advised. The SARS-CoV-2 RNA is generally  detectable in upper and lower respiratory sp ecimens during the acute  phase of infection. The expected result is Negative. Fact Sheet for Patients:  BoilerBrush.com.cy Fact Sheet for Healthcare Providers: https://pope.com/ This test is not yet approved or cleared by the [place] FDA and has been authorized for detection and/or diagnosis of SARS-CoV-2 by FDA under an Emergency Use Authorization (EUA).  This EUA will remain in effect (meaning this test can be used) for the duration of the COVID-19 declaration under Section 564(b)(1) of the Act, 21 U.S.C. section 360bbb-3(b)(1), unless the authorization is terminated or revoked sooner. Performed at [facility], [address], [place], [place] [address]   Blood Culture (routine x 2)     Status: None (Preliminary result)   Collection Time: 03/14/19 10:04 AM  Result Value Ref Range Status   Specimen Description BLOOD LEFT HAND  Final   Special Requests   Final    BOTTLES DRAWN AEROBIC ONLY Blood Culture results may not be optimal due to an inadequate volume of blood received in culture bottles   Culture   Final    NO GROWTH 1 DAY Performed at [facility], [address], [place], [place] [address]    Report Status PENDING  Incomplete  Urine culture     Status: None   Collection Time: 03/14/19 12:14 PM  Result Value Ref Range Status   Specimen Description URINE, CLEAN CATCH  Final   Special Requests NONE  Final   Culture   Final    NO GROWTH Performed at [facility], [address] [place], [place] [address]    Report Status 03/15/2019 FINAL  Final  CSF culture     Status: None (Preliminary result)   Collection Time: 03/14/19  1:13 PM  Result Value Ref Range Status   Specimen Description CSF  Final   Special Requests Immunocompromised  Final    Gram Stain CYTOSPIN SMEAR NO WBC SEEN NO ORGANISMS SEEN   Final   Culture   Final    NO GROWTH < 24 HOURS Performed at [facility], [address] [place], [place] [address]    Report Status PENDING  Incomplete  Culture, fungus without smear     Status: None (Preliminary result)   Collection Time: 03/14/19  1:13 PM  Result Value Ref Range Status   Specimen Description CSF  Final   Special Requests Immunocompromised  Final   Culture   Final    NO FUNGUS ISOLATED AFTER 1 DAY Performed at [facility], [address] [place], [place] [address]    Report Status PENDING  Incomplete     Time coordinating discharge: 35 minutes  SIGNED:   Glade Lloyd, MD  [facility] 03/16/2019, 8:50 AM

## 2019-03-16 NOTE — Progress Notes (Signed)
Pt discharged to home. I printed out the AVS and discussed it with him, including follow up and discharge medications, especially the importance of finishing entire course of antibiotics. Pt taught back and verbalized understanding. IV removed after dose of IV Unasyn was complete. Pt calling his wife to come pick him up. Pt in stable condition.

## 2019-03-17 LAB — CSF CULTURE W GRAM STAIN: Culture: NO GROWTH

## 2019-03-17 LAB — HSV 1/2 AB IGG/IGM CSF
HSV 1/2 Ab Screen IgG, CSF: <0.34 IV (ref <=0.89)
HSV 1/2 Ab, IgM, CSF: 0.14 IV (ref <=0.89)

## 2019-03-19 ENCOUNTER — Other Ambulatory Visit: Payer: Self-pay | Admitting: Oncology

## 2019-03-19 ENCOUNTER — Telehealth: Payer: Self-pay | Admitting: Oncology

## 2019-03-19 ENCOUNTER — Telehealth: Payer: Self-pay | Admitting: *Deleted

## 2019-03-19 DIAGNOSIS — D72819 Decreased white blood cell count, unspecified: Secondary | ICD-10-CM

## 2019-03-19 LAB — CULTURE, BLOOD (ROUTINE X 2)
Culture: NO GROWTH
Culture: NO GROWTH
Special Requests: ADEQUATE

## 2019-03-19 LAB — HIV 1/2 AB DIFFERENTIATION
HIV 1 Ab: POSITIVE — AB
HIV 2 Ab: NEGATIVE

## 2019-03-19 LAB — HIV ANTIBODY (ROUTINE TESTING W REFLEX): HIV Screen 4th Generation wRfx: REACTIVE — AB

## 2019-03-19 NOTE — Telephone Encounter (Signed)
-----   Message from Veryl Speak, FNP sent at 03/19/2019  2:18 PM EDT ----- Regarding: New HIV Good afternoon,  New HIV patient that we will need to get established.  Thanks!

## 2019-03-19 NOTE — Telephone Encounter (Signed)
Per request from provider information sent to [facility] for follow up and connection to care.

## 2019-03-19 NOTE — Telephone Encounter (Signed)
Scheduled appt per 5/26 sch message - unable to reach patient . Left message with appt date and time   

## 2019-03-20 ENCOUNTER — Inpatient Hospital Stay: Payer: Self-pay | Admitting: Adult Health

## 2019-03-20 ENCOUNTER — Inpatient Hospital Stay: Payer: Commercial Managed Care - PPO

## 2019-03-20 ENCOUNTER — Inpatient Hospital Stay: Payer: Commercial Managed Care - PPO | Attending: Oncology | Admitting: Oncology

## 2019-03-20 ENCOUNTER — Other Ambulatory Visit: Payer: Self-pay

## 2019-03-20 VITALS — BP 126/80 | HR 86 | Temp 98.3°F | Resp 18 | Ht 71.0 in | Wt 191.9 lb

## 2019-03-20 DIAGNOSIS — D72819 Decreased white blood cell count, unspecified: Secondary | ICD-10-CM | POA: Insufficient documentation

## 2019-03-20 DIAGNOSIS — D721 Eosinophilia, unspecified: Secondary | ICD-10-CM

## 2019-03-20 DIAGNOSIS — Z79899 Other long term (current) drug therapy: Secondary | ICD-10-CM | POA: Diagnosis not present

## 2019-03-20 LAB — CBC WITH DIFFERENTIAL (CANCER CENTER ONLY)
Abs Immature Granulocytes: 0 10*3/uL (ref 0.00–0.07)
Band Neutrophils: 2 %
Basophils Absolute: 0 10*3/uL (ref 0.0–0.1)
Basophils Relative: 0 %
Eosinophils Absolute: 0.2 10*3/uL (ref 0.0–0.5)
Eosinophils Relative: 14 %
HCT: 37.8 % — ABNORMAL LOW (ref 39.0–52.0)
Hemoglobin: 11.8 g/dL — ABNORMAL LOW (ref 13.0–17.0)
Lymphocytes Relative: 24 %
Lymphs Abs: 0.3 10*3/uL — ABNORMAL LOW (ref 0.7–4.0)
MCH: 26.9 pg (ref 26.0–34.0)
MCHC: 31.2 g/dL (ref 30.0–36.0)
MCV: 86.1 fL (ref 80.0–100.0)
Metamyelocytes Relative: 2 %
Monocytes Absolute: 0.3 10*3/uL (ref 0.1–1.0)
Monocytes Relative: 23 %
Neutro Abs: 0.5 10*3/uL — ABNORMAL LOW (ref 1.7–17.7)
Neutrophils Relative %: 35 %
Platelet Count: 178 10*3/uL (ref 150–400)
RBC: 4.39 MIL/uL (ref 4.22–5.81)
RDW: 17.2 % — ABNORMAL HIGH (ref 11.5–15.5)
WBC Count: 1.3 10*3/uL — ABNORMAL LOW (ref 4.0–10.5)
nRBC: 0 % (ref 0.0–0.2)

## 2019-03-20 LAB — METHOTREXATE

## 2019-03-20 NOTE — Progress Notes (Signed)
Hematology and Oncology Follow Up Visit  Joel Wright [id number] 01-23-1986 33 y.o. 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  Etiology related to the primary myeloproliferative disorder versus reactive to atopic dermatitis.  Current therapy: Under evaluation for bone marrow biopsy.  Interim History: Joel Wright is here for a follow-up visit.  Since the last visit, he was hospitalized between May 21 and 23 for symptoms of fever and altered mental status.  He was found to have a temperature 103.2 and mild cytopenia.  He has been on methotrexate for atopic dermatitis and has taken it for 3 weeks.  During his hospitalization his work-up has been unrevealing for source of infection.  It was thought to be related to sinusitis.  Methotrexate was held because of neutropenia with an absolute neutrophil count of around 900.  Since his discharge, he remains on Augmentin with no longer reporting any fevers or chills.  He does report some headaches and dizziness.  He denies any altered mental status or lethargy.  He  denies any night sweats, weight loss or changes in appetite.  Denied orthopnea, dyspnea on exertion or chest discomfort.  Denies shortness of breath, difficulty breathing hemoptysis or cough.  Denies any abdominal distention, nausea, early satiety or dyspepsia.  Denies any hematuria, frequency, dysuria or nocturia.  Denies any skin irritation, dryness or rash.  Denies any ecchymosis or petechiae.  Denies any lymphadenopathy or clotting.  Denies any heat or cold intolerance.  Denies any anxiety or depression.  Remaining review of system is negative.      Medications: I have reviewed the patient's current medications.  Current Outpatient Medications  Medication Sig Dispense Refill  . amoxicillin-clavulanate (AUGMENTIN) 875-125 MG tablet Take 1 tablet by mouth 2  (two) times daily for 5 days. 10 tablet 0  . folic acid (FOLVITE) 1 MG tablet Take 1 mg by mouth daily.     No current facility-administered medications for this visit.      Allergies:  Allergies  Allergen Reactions  . Other Hives    Oranges    Past Medical History, Surgical history, Social history, and Family History were reviewed and updated.   Physical Exam: Blood pressure 126/80, pulse 86, temperature 98.3 F (36.8 C), temperature source Oral, resp. rate 18, height _0  (1.803 m), weight 191 lb 14.4 oz (87 kg), SpO2 100 %.   ECOG: 0   General appearance: Alert, awake without any distress. Head: Atraumatic without abnormalities Oropharynx: Without any thrush or ulcers. Eyes: No scleral icterus. Lymph nodes: No lymphadenopathy noted in the cervical, supraclavicular, or axillary nodes Heart:regular rate and rhythm, without any murmurs or gallops.   Lung: Clear to auscultation without any rhonchi, wheezes or dullness to percussion. Abdomin: Soft, nontender without any shifting dullness or ascites. Musculoskeletal: No clubbing or cyanosis. Neurological: No motor or sensory deficits. Skin: No rashes or lesions.    Lab Results: Lab Results  Component Value Date   WBC 1.3 (L) 03/20/2019   HGB 11.8 (L) 03/20/2019   HCT 37.8 (L) 03/20/2019   MCV 86.1 03/20/2019   PLT 178 03/20/2019     Chemistry      Component Value Date/Time   NA 143 03/16/2019 0319   K 3.8 03/16/2019 0319   CL 113 (H) 03/16/2019 0319   CO2 21 (L) 03/16/2019 0319   BUN 12 03/16/2019 0319   CREATININE 0.70 03/16/2019 0319  Component Value Date/Time   CALCIUM 9.3 03/16/2019 0319   ALKPHOS 26 (L) 03/16/2019 0319   AST 34 03/16/2019 0319   ALT 32 03/16/2019 0319   BILITOT 0.5 03/16/2019 0319       Radiological Studies:    Impression and Plan:  33 year old man with:  1.  Eosinophilia associated with leukocytopenia diagnosed in 2019.     The differential diagnosis was  reiterated again with the etiology likely related to be reactive due to atopic dermatitis.  Myeloproliferative disorder with eosinophilia is also a possibility at this time.  Given his recent hospitalization and in usual recent complaints, I have recommended proceeding with a bone marrow biopsy for full characterization.  Risks and benefits of obtaining a bone marrow biopsy was discussed.  Complications including pain, bleeding and infection.  Once these results are available I will communicate to him our next steps of management.  2.    Headaches and dizziness: MRI of the brain did not show any evidence of malignancy or infection.  I recommended evaluation by neurology if these symptoms persist.   3.  Pruritus: Related to atopic dermatitis which is improved with methotrexate.  Follow-up with dermatology.  4.  Follow-up: Will be determined pending the results of his bone marrow biopsy.  25  minutes was spent with the patient face-to-face today.  More than 50% of time was spent on reviewing his disease status, treatment options, complications related to upcoming procedure and future plan of care.     Zola Button, MD 5/27/20203:16 PM

## 2019-03-21 ENCOUNTER — Encounter: Payer: Self-pay | Admitting: Adult Health

## 2019-03-21 ENCOUNTER — Telehealth (HOSPITAL_COMMUNITY): Payer: Self-pay

## 2019-03-21 ENCOUNTER — Ambulatory Visit (INDEPENDENT_AMBULATORY_CARE_PROVIDER_SITE_OTHER): Payer: Commercial Managed Care - PPO | Admitting: Adult Health

## 2019-03-21 ENCOUNTER — Inpatient Hospital Stay: Payer: Self-pay | Admitting: Adult Health

## 2019-03-21 VITALS — BP 120/80 | Temp 98.3°F | Wt 191.0 lb

## 2019-03-21 DIAGNOSIS — B20 Human immunodeficiency virus [HIV] disease: Secondary | ICD-10-CM

## 2019-03-21 NOTE — Patient Instructions (Signed)
Someone will contact you from Infectious Disease   Please call Erie Noe at [phone] - she is with [employer] [facility] [employer]

## 2019-03-21 NOTE — Progress Notes (Signed)
Subjective:    Patient ID: Joel Nora., male    DOB: [birth year], 33 y.o.   MRN: [id number]  HPI 33 year old male who  has a past medical history of Chicken pox, Eosinophilia, Leukopenia, and Migraines.  Presents to the clinic today for TCM visit   Admit Date: 03/14/2019 Discharge Date: 03/16/2019  He presented to the emergency room on 03/14/2019 with fever and malaise over 3 days prior.  In the emergency room he was found to be febrile up to 103.2 along with pancytopenia.  Is also having episodes of confusion and word finding difficulties.  CT scan of the brain showed multifocal pain nasal sinus disease.  He was started on broad-spectrum antibiotics.  Lumbar puncture ruled out the possibility of meningitis.  MRI of the brain was negative for acute abnormality except for sinus disease.  His antibiotics were switched to Unasyn.  EEG was negative.  And he was discharged on oral Augmentin.  Hospital Course  1. Fever of Questionable Cause/Sinusitis/Acute Metabolic encephalopathy/pancytopenia  -Questionable cause.  Work-up revealed multifocal pan nasal sinus disease.  MRI of the brain was negative for abnormality except for sinus disease.  Chest x-ray was negative for any acute infiltrate.  He was started on Vanco and Rocephin and switched to Unasyn -Lumbar puncture on presentation showed 0 WBCs, hence meningitis was ruled out C SF cryptococcal antigen was negative.  Blood, urine, and CSF cultures are negative -COVID-19 rapid testing was negative -RPR was nonreactive -He was still pancytopenic upon discharge but thought it might be secondary to recent methotrexate use. -Recommended outpatient hematology follow-up with Dr. Alen Blew.   2. AKI  - Improved with IV fluids  3. Atopic dermatitis, on methotrexate therapy -[facility].  He was recently started on methotrexate 4 weeks ago with his last dose on 03/11/2019 -Commending holding methotrexate on discharge    [place] yesterday by Dr.Shadad.  At this appointment he remained on Augmentin with no reported fevers or chills.  He did report some headaches and dizziness.  Denied altered mental status or lethargy -Even his recent hospitalization in an unusual recent complaints it was recommended proceeding with a bone marrow biopsy for full characterization. -Is also recommended that he be evaluated by neurology if his headaches or dizziness continues  After he was discharged from the hospital his labs came back showing that he was HIV positive.  He continues to complain of severe fatigue and front headaches with photophobia. Denies fevers or chills. He reports that he went to work yesterday and could only work for 3 hours before having to go home.   Wt Readings from Last 3 Encounters:  03/21/19 191 lb (86.6 kg)  03/20/19 191 lb 14.4 oz (87 kg)  03/14/19 205 lb (93 kg)     Review of Systems  Constitutional: Positive for activity change and fatigue. Negative for chills and fever.  Respiratory: Negative.   Gastrointestinal: Negative.   Genitourinary: Negative.   Musculoskeletal: Negative.   Neurological: Negative.   Hematological: Negative.   Psychiatric/Behavioral: Positive for sleep disturbance.   Past Medical History:  Diagnosis Date  . Chicken pox   . Eosinophilia   . Leukopenia   . Migraines     Social History   Socioeconomic History  . Marital status: Married    Spouse name: Lorne Skeens  . Number of children: 2  . Years of education: Not on file  . Highest education level: Not on file  Occupational  History  . Not on file  Social Needs  . Financial resource strain: Not on file  . Food insecurity:    Worry: Not on file    Inability: Not on file  . Transportation needs:    Medical: Not on file    Non-medical: Not on file  Tobacco Use  . Smoking status: Never Smoker  . Smokeless tobacco: Never Used  Substance and Sexual Activity  . Alcohol use: Yes     Frequency: Never    Comment: occ  . Drug use: No  . Sexual activity: Yes  Lifestyle  . Physical activity:    Days per week: Not on file    Minutes per session: Not on file  . Stress: Not on file  Relationships  . Social connections:    Talks on phone: Not on file    Gets together: Not on file    Attends religious service: Not on file    Active member of club or organization: Not on file    Attends meetings of clubs or organizations: Not on file    Relationship status: Not on file  . Intimate partner violence:    Fear of current or ex partner: Not on file    Emotionally abused: Not on file    Physically abused: Not on file    Forced sexual activity: Not on file  Other Topics Concern  . 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 TONSILLECTOMY  03/2017    Family History  Problem Relation Age of Onset  . Hypertension Mother   . Breast cancer Mother   . Diabetes Mellitus II Mother   . Hyperlipidemia Father        2013  . Heart attack Father   . Colon cancer Maternal Grandmother        Diagnosed in 1994   . Heart attack Paternal Grandmother   . Esophageal cancer Paternal Grandfather     Allergies  Allergen Reactions  . Other Hives    Oranges    Current Outpatient Medications on File Prior to Visit  Medication Sig Dispense Refill  . folic acid (FOLVITE) 1 MG tablet Take 1 mg by mouth daily.     No current facility-administered medications on file prior to visit.     BP 120/80   Temp 98.3 F (36.8 C)   Wt 191 lb (86.6 kg)   BMI 26.64 kg/m       Objective:   Physical Exam Vitals signs and nursing note reviewed.  Constitutional:      Appearance: Normal appearance. He is normal weight.  Skin:    General: Skin is warm and dry.  Neurological:     Mental Status: He is alert.  Psychiatric:        Mood and  Affect: Affect is tearful.        Speech: Speech normal.        Behavior: Behavior normal. Behavior is cooperative.        Thought Content: Thought content normal.        Cognition and Memory: Cognition and memory normal.       Assessment & Plan:  1. Symptomatic HIV infection ([place]) -We discussed his diagnosis of HIV.  This was the first time he  was learning this diagnosis as he was not contacted by the hospital after labs resulted.  He was tearful at times, which is to be expected.  He was given time to ask questions and I answered to the best of my ability.  I advised him that his wife and children will likely need to be tested as well.  He was also advised that this is a manageable disease with the medications that they have on the market.  He can follow-up with me at any time as I am sure he is going to have questions once the initial shock of the diagnosis has set in  --Per epic it looks like infectious disease is trying to get him scheduled for an appointment.  We will put place formal consult.  Wondering if his headaches and photosensitivity are due to a low CD4 count. - Ambulatory referral to Infectious Disease - Will send information to health department   Dorothyann Peng, NP

## 2019-03-26 ENCOUNTER — Other Ambulatory Visit (HOSPITAL_COMMUNITY): Payer: Commercial Managed Care - PPO

## 2019-03-26 ENCOUNTER — Telehealth: Payer: Self-pay

## 2019-03-26 ENCOUNTER — Other Ambulatory Visit: Payer: Commercial Managed Care - PPO

## 2019-03-26 ENCOUNTER — Encounter: Payer: Self-pay | Admitting: *Deleted

## 2019-03-26 DIAGNOSIS — Z20822 Contact with and (suspected) exposure to covid-19: Secondary | ICD-10-CM

## 2019-03-26 NOTE — Telephone Encounter (Signed)
Phone call to pt. due to recent hospitalization, and potential exposure to known COVID positive individual.  Left voice message for pt. to return call to [phone], from 7:00-7:00 PM, to discuss further, and schedule testing for COVID 19.

## 2019-03-26 NOTE — Telephone Encounter (Signed)
This encounter was created in error - please disregard.

## 2019-03-26 NOTE — Addendum Note (Signed)
Addended by: Phillips Odor on: 03/26/2019 01:28 PM   Modules accepted: Orders

## 2019-03-26 NOTE — Telephone Encounter (Signed)
Pt. Was notified of appt. For COVID 19 testing today at 3:30 PM at the [facility].  Verb. Understanding.  Agreed with plan.

## 2019-03-27 LAB — NOVEL CORONAVIRUS, NAA: SARS-CoV-2, NAA: NOT DETECTED

## 2019-03-28 ENCOUNTER — Ambulatory Visit (HOSPITAL_COMMUNITY): Payer: Commercial Managed Care - PPO

## 2019-03-28 ENCOUNTER — Encounter: Payer: Self-pay | Admitting: Adult Health

## 2019-04-01 ENCOUNTER — Encounter: Payer: Self-pay | Admitting: Internal Medicine

## 2019-04-01 ENCOUNTER — Other Ambulatory Visit (HOSPITAL_COMMUNITY)
Admission: RE | Admit: 2019-04-01 | Discharge: 2019-04-01 | Disposition: A | Discharge status: Home / Self Care | Payer: Commercial Managed Care - PPO | Source: Ambulatory Visit | Attending: Internal Medicine | Admitting: Internal Medicine

## 2019-04-01 ENCOUNTER — Other Ambulatory Visit: Payer: Self-pay

## 2019-04-01 ENCOUNTER — Telehealth: Payer: Self-pay

## 2019-04-01 ENCOUNTER — Telehealth: Payer: Self-pay | Admitting: Pharmacy Technician

## 2019-04-01 ENCOUNTER — Ambulatory Visit: Payer: Commercial Managed Care - PPO | Admitting: Internal Medicine

## 2019-04-01 VITALS — BP 125/82 | HR 98 | Temp 98.1°F | Wt 193.0 lb

## 2019-04-01 DIAGNOSIS — Z21 Asymptomatic human immunodeficiency virus [HIV] infection status: Secondary | ICD-10-CM

## 2019-04-01 DIAGNOSIS — B20 Human immunodeficiency virus [HIV] disease: Secondary | ICD-10-CM | POA: Diagnosis not present

## 2019-04-01 DIAGNOSIS — L819 Disorder of pigmentation, unspecified: Secondary | ICD-10-CM | POA: Diagnosis not present

## 2019-04-01 DIAGNOSIS — Z23 Encounter for immunization: Secondary | ICD-10-CM | POA: Diagnosis not present

## 2019-04-01 MED ORDER — AZITHROMYCIN 600 MG PO TABS: 1200.0000 mg | ORAL_TABLET | ORAL | 5 refills | Status: DC

## 2019-04-01 MED ORDER — SULFAMETHOXAZOLE-TRIMETHOPRIM 400-80 MG PO TABS: 1.0000 | ORAL_TABLET | Freq: Every day | ORAL | 6 refills | Status: DC

## 2019-04-01 MED ORDER — BICTEGRAVIR-EMTRICITAB-TENOFOV 50-200-25 MG PO TABS: 1.0000 | ORAL_TABLET | Freq: Every day | ORAL | 11 refills | Status: DC

## 2019-04-01 NOTE — Telephone Encounter (Signed)
Per Dr Baxter Flattery , patient is no longer taking methotrexate. OK to refill Bactrim.    Laverle Patter, RN

## 2019-04-01 NOTE — Progress Notes (Signed)
HPI: Joel Beever. is a 33 y.o. male who presents to the [facility] clinic today to initiate care with Dr. Baxter Flattery for his newly diagnosed HIV infection.  Patient Active Problem List   Diagnosis Date Noted  . Neutropenic fever ([place]) 03/14/2019  . AKI (acute kidney injury) ([place]) 03/14/2019  . On methotrexate therapy 03/14/2019  . Thrombocytopenia ([place]) 03/14/2019  . Acute encephalopathy 03/14/2019    Patient's Medications  New Prescriptions   AZITHROMYCIN (ZITHROMAX) 600 MG TABLET    Take 2 tablets (1,200 mg total) by mouth every 7 (seven) days.   BICTEGRAVIR-EMTRICITABINE-TENOFOVIR AF (BIKTARVY) 50-200-25 MG TABS TABLET    Take 1 tablet by mouth daily.   SULFAMETHOXAZOLE-TRIMETHOPRIM (BACTRIM) 400-80 MG TABLET    Take 1 tablet by mouth daily.  Previous Medications   FOLIC ACID (FOLVITE) 1 MG TABLET    Take 1 mg by mouth daily.  Modified Medications   No medications on file  Discontinued Medications   No medications on file    Allergies: Allergies  Allergen Reactions  . Other Hives    Oranges    Past Medical History: Past Medical History:  Diagnosis Date  . Chicken pox   . Eosinophilia   . HIV (human immunodeficiency virus infection) ([place])   . Leukopenia   . Migraines     Social History: Social History   Socioeconomic History  . Marital status: Married    Spouse name: Joel Wright  . Number of children: 2  . Years of education: Not on file  . Highest education level: Not on file  Occupational History  . Not on file  Social Needs  . Financial resource strain: Not on file  . Food insecurity:    Worry: Not on file    Inability: Not on file  . Transportation needs:    Medical: Not on file    Non-medical: Not on file  Tobacco Use  . Smoking status: Never Smoker  . Smokeless tobacco: Never Used  Substance and Sexual Activity  . Alcohol use: Yes    Frequency: Never    Comment: occ  . Drug use: No  . Sexual activity: Yes  Lifestyle  . Physical activity:    Days per week: Not on file    Minutes per session: Not on file  . Stress: Not on file  Relationships  . Social connections:    Talks on phone: Not on file    Gets together: Not on file    Attends religious service: Not on file    Active member of club or organization: Not on file    Attends meetings of clubs or organizations: Not on file    Relationship status: Not on file  Other Topics Concern  . 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  Hepatitis B No results found for: HEPBSAB, HEPBSAG, HEPBCAB Hepatitis C No results found for: HEPCAB, HCVRNAPCRQN Hepatitis A No results found for: HAV Lipids: No results found for: CHOL, TRIG, HDL, CHOLHDL, VLDL, LDLCALC  Current HIV Regimen: Treatment naive  Assessment: Joel Wright is here today to initiate care with Dr. Baxter Flattery for his newly diagnosed HIV  infection.  He is treatment naive and has not had any labs done thus far.  He will have a HIV RNA and CD4 count checked today. Will start patient on Biktarvy.   Explained that Susanne BordersBiktarvy is a one pill once daily medication with or without food and the importance of not missing any doses. Explained resistance and how it develops and why it is so important to take Biktarvy daily and not skip days or doses. Counseled patient to take it around the same time each day. Counseled on what to do if dose is missed, if closer to missed dose take immediately, if closer to next dose then skip and resume normal schedule.   Cautioned on possible side effects the first week or so including nausea, diarrhea, dizziness, and headaches but that they should resolve after the first couple of weeks. I reviewed patient medications and found no drug  interactions. Counseled patient to separate Biktarvy from divalent cations including multivitamins. Discussed with patient to call clinic if he starts a new medication or herbal supplement. I gave the patient my card and told him to call me with any issues/questions/concerns.  He will also be started on Bactrim and azithromycin for OI prophylaxis. He is insured and Lorene DyChristie is calling his insurance to make sure everything is ok with his coverage.  [facility] will fill at [facility].    Plan: - Start Biktarvy PO once daily - Start Bactrim SS once daily - Start azithromycin 1200 mg PO once weekly  Cassie L. Kuppelweiser, PharmD, BCIDP, AAHIVP, CPP Infectious Diseases Clinical Pharmacist [facility] 04/01/2019, 3:09 PM

## 2019-04-01 NOTE — Telephone Encounter (Signed)
[facility] Patient Advocate Encounter    Findings of the benefits investigation conducted this morning via test claims for the patient's upcoming appointment on 04/01/2019 are as follows:   Insurance: [employer] (active commercial)  Prior Authorization: to be determined when new medication prescribed (appears to be cost exceeds max formulary on specialty medications)  [facility] Patient Advocate will follow up once patient arrives for their appointment.  Bartholomew Crews, CPhT Specialty Pharmacy Patient [facility] for Infectious Disease Phone: [phone] Fax: [phone] 04/01/2019 2:11 PM

## 2019-04-01 NOTE — Telephone Encounter (Signed)
Voicemail:  Pharmacy  [facility] , [place]  Bactrim drug interaction with methotrexate , medication sent today.  Please advise on dispensing .   Laverle Patter, RN

## 2019-04-01 NOTE — Progress Notes (Signed)
Patient ID: Joel Wright Alvie Heidelberg., male   DOB: 08/12/1986, 33 y.o.   MRN: [id number]  HPI  Joel Wright is a 33 yo M with recent HIV diagnosis. He recalls having  Noticed black lesions, pruritic in nature to legs and back - in November.Dr Joel Wright  At [facility] assessed as atopic dermatitis and treated initially with topical steroids. Patient does not recall skin biopsy but given methotrexate (for last 2-3 wks) plus topical steroid therapy to see if improved.  In the meantime, he had an isolated fever that brought him to the hospital and was subsequently found to have HIV + ab.  Last tested 2011. Sexual history: 18 yrs ago when first started to be sexually - who was HIV+male partner. He is currently married since 2012 to a woman who is hiv negative. In 2014, he did have male partner of unknown HIV status.  Current spouse -she is negative. Hx of anal warts.  2012 - married to a woman. Has 70 and 35 year old.  Supportive during disclosure. 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  Soc hx: no smoking; occ drinking  Family hx : breast ca on mother's side  Allergies  Allergen Reactions  . Other Hives    Oranges     Outpatient Encounter Medications as of 04/01/2019  Medication Sig  . folic acid (FOLVITE) 1 MG tablet Take 1 mg by mouth daily.   No facility-administered encounter medications on file as of 04/01/2019.      Patient Active Problem List   Diagnosis Date Noted  . Neutropenic fever ([place]) 03/14/2019  . AKI (acute kidney injury) ([place]) 03/14/2019  . On methotrexate therapy 03/14/2019  . Thrombocytopenia ([place]) 03/14/2019  . Acute encephalopathy 03/14/2019     There are no preventive care reminders to display for this patient.   Review of Systems Review of Systems  Constitutional: Negative for fever, chills, diaphoresis, activity change, appetite change, fatigue and unexpected weight change.  HENT: Negative for congestion, sore throat, rhinorrhea,  sneezing, trouble swallowing and sinus pressure.  Eyes: Negative for photophobia and visual disturbance.  Respiratory: Negative for cough, chest tightness, shortness of breath, wheezing and stridor.  Cardiovascular: Negative for chest pain, palpitations and leg swelling.  Gastrointestinal: Negative for nausea, vomiting, abdominal pain, diarrhea, constipation, blood in stool, abdominal distention and anal bleeding.  Genitourinary: Negative for dysuria, hematuria, flank pain and difficulty urinating.  Musculoskeletal: Negative for myalgias, back pain, joint swelling, arthralgias and gait problem.  Skin: Neg+rahs to legs Neurological: Negative for dizziness, tremors, weakness and light-headedness.  Hematological: Negative for adenopathy. Does not bruise/bleed easily.  Psychiatric/Behavioral: concern about diagnosis   Physical Exam   BP 125/82   Pulse 98   Temp 98.1 F (36.7 C) (Oral)   Wt 193 lb (87.5 kg)   BMI 26.92 kg/m   Physical Exam  Constitutional: He is oriented to person, place, and time. He appears well-developed and well-nourished. No distress.  HENT:  Mouth/Throat: Oropharynx is clear and moist. No oropharyngeal exudate.  Cardiovascular: Normal rate, regular rhythm and normal heart sounds. Exam reveals no gallop and no friction rub.  No murmur heard.  Pulmonary/Chest: Effort normal and breath sounds normal. No respiratory distress. He has no wheezes.  Abdominal: Soft. Bowel sounds are normal. He exhibits no distension. There is no tenderness.  Lymphadenopathy:  He has no cervical adenopathy.  Neurological: He is alert and oriented to person, place, and time.  Skin: hyperpigmented macules,  some have papule features. Predominantly on legs but a few lesions on buttock and torso Psychiatric: He has a normal mood and affect. His behavior is normal.    Lab Results  Component Value Date   LABRPR Non Reactive 03/14/2019    CBC Lab Results  Component Value Date   WBC 1.3  (L) 03/20/2019   RBC 4.39 03/20/2019   HGB 11.8 (L) 03/20/2019   HCT 37.8 (L) 03/20/2019   PLT 178 03/20/2019   MCV 86.1 03/20/2019   MCH 26.9 03/20/2019   MCHC 31.2 03/20/2019   RDW 17.2 (H) 03/20/2019   LYMPHSABS 0.3 (L) 03/20/2019   MONOABS 0.3 03/20/2019   EOSABS 0.2 03/20/2019    BMET Lab Results  Component Value Date   NA 143 03/16/2019   K 3.8 03/16/2019   CL 113 (H) 03/16/2019   CO2 21 (L) 03/16/2019   GLUCOSE 118 (H) 03/16/2019   BUN 12 03/16/2019   CREATININE 0.70 03/16/2019   CALCIUM 9.3 03/16/2019   GFRNONAA >60 03/16/2019   GFRAA >60 03/16/2019      Assessment and Plan  Hyperpigmented lesion = concern for ks. Will get derm to biopsy  hiv = appears to have advanced hiv disease due to WBC of 1.2 and TLC of 300, thus likely estimated CD 4 coutn fo < 50. We will plan to start on biktarvy daily  oi proph = bactrim ss daily, plus azithromycin 1200mg  weekly  Health maintenance =will give prevnar 13 today. Anticipate to offer further vaccines once information comes from lab work today.

## 2019-04-02 ENCOUNTER — Telehealth: Payer: Self-pay | Admitting: *Deleted

## 2019-04-02 LAB — CYTOLOGY, (ORAL, ANAL, URETHRAL) ANCILLARY ONLY
Chlamydia: NEGATIVE
Neisseria Gonorrhea: NEGATIVE

## 2019-04-02 LAB — T-HELPER CELL (CD4) - (RCID CLINIC ONLY)
CD4 % Helper T Cell: 1 % — ABNORMAL LOW (ref 33–65)
CD4 T Cell Abs: <35 /uL — ABNORMAL LOW (ref 400–1790)

## 2019-04-02 MED FILL — AZITHROMYCIN 600 MG TABS: 600 | 28 days supply | Qty: 10 | Fill #0

## 2019-04-02 MED FILL — SULFAMETHOXAZOLE-TMP SS TAB: 400-80 | 30 days supply | Qty: 30 | Fill #0

## 2019-04-02 MED FILL — BIKTARVY 50-200-25 MG TABS: 50-200-25 | 30 days supply | Qty: 30 | Fill #0

## 2019-04-02 NOTE — Telephone Encounter (Signed)
[facility] Patient Advocate Encounter   Was successful in obtaining a [place] copay card for [employer].  This copay card will make the patients copay $0.  I have spoken with the patient and let him know his medication went through insurance.    The billing information is as follows and has been shared with [place].  RxBin: [id number] PCN: [id number] Member ID: [id number] Group ID: [id number]  Bartholomew Crews, CPhT Specialty Pharmacy Patient [facility] Phone: [phone] Fax: [phone] 04/02/2019 8:57 AM

## 2019-04-02 NOTE — Telephone Encounter (Signed)
Thanks! Will call patient and let him know

## 2019-04-02 NOTE — Telephone Encounter (Signed)
Received fax in triage with prior authorization approval for patient's biktarvy (claim dollar exception) through 03/31/2020. Reference: [id number] Landis Gandy, RN

## 2019-04-04 LAB — CULTURE, FUNGUS WITHOUT SMEAR

## 2019-04-09 NOTE — Telephone Encounter (Signed)
Spoke patient over the phone regarding recent MyChart message. Advised patient to take OTC medication for chest congestion. And if he needs to then contact his PCP for further advise if it get worse.  Patient agreed.  Eugenia Mcalpine, LPN

## 2019-04-13 LAB — CBC WITH DIFFERENTIAL/PLATELET
Absolute Monocytes: 154 cells/uL — ABNORMAL LOW (ref 200–950)
Basophils Absolute: 11 cells/uL (ref 0–200)
Basophils Relative: 0.9 %
Eosinophils Absolute: 287 cells/uL (ref 15–500)
Eosinophils Relative: 23.9 %
HCT: 36.9 % — ABNORMAL LOW (ref 38.5–50.0)
Hemoglobin: 11.9 g/dL — ABNORMAL LOW (ref 13.2–17.1)
Lymphs Abs: 298 cells/uL — ABNORMAL LOW (ref 850–3900)
MCH: 27.4 pg (ref 27.0–33.0)
MCHC: 32.2 g/dL (ref 32.0–36.0)
MCV: 84.8 fL (ref 80.0–100.0)
MPV: 12.9 fL — ABNORMAL HIGH (ref 7.5–12.5)
Monocytes Relative: 12.8 %
Neutro Abs: 451 cells/uL — ABNORMAL LOW (ref 1500–7800)
Neutrophils Relative %: 37.6 %
Platelets: 182 10*3/uL (ref 140–400)
RBC: 4.35 10*6/uL (ref 4.20–5.80)
RDW: 17.7 % — ABNORMAL HIGH (ref 11.0–15.0)
Total Lymphocyte: 24.8 %
WBC: 1.2 10*3/uL — ABNORMAL LOW (ref 3.8–10.8)

## 2019-04-13 LAB — COMPLETE METABOLIC PANEL WITH GFR
AG Ratio: 1.8 (calc) (ref 1.0–2.5)
ALT: 33 U/L (ref 9–46)
AST: 30 U/L (ref 10–40)
Albumin: 4.6 g/dL (ref 3.6–5.1)
Alkaline phosphatase (APISO): 31 U/L — ABNORMAL LOW (ref 36–130)
BUN: 13 mg/dL (ref 7–25)
CO2: 28 mmol/L (ref 20–32)
Calcium: 10.7 mg/dL — ABNORMAL HIGH (ref 8.6–10.3)
Chloride: 107 mmol/L (ref 98–110)
Creat: 0.81 mg/dL (ref 0.60–1.35)
GFR, Est African American: 134 mL/min/{1.73_m2} (ref >=60)
GFR, Est Non African American: 116 mL/min/{1.73_m2} (ref >=60)
Globulin: 2.5 g/dL (calc) (ref 1.9–3.7)
Glucose, Bld: 106 mg/dL — ABNORMAL HIGH (ref 65–99)
Potassium: 4.2 mmol/L (ref 3.5–5.3)
Sodium: 143 mmol/L (ref 135–146)
Total Bilirubin: 0.9 mg/dL (ref 0.2–1.2)
Total Protein: 7.1 g/dL (ref 6.1–8.1)

## 2019-04-13 LAB — HIV-1 RNA ULTRAQUANT REFLEX TO GENTYP+
HIV 1 RNA Quant: 490000 copies/mL — ABNORMAL HIGH
HIV-1 RNA Quant, Log: 5.69 Log copies/mL — ABNORMAL HIGH

## 2019-04-13 LAB — HIV-1 GENOTYPE: HIV-1 Genotype: DETECTED — AB

## 2019-04-13 LAB — HEPATITIS C ANTIBODY
Hepatitis C Ab: NONREACTIVE
SIGNAL TO CUT-OFF: 0.04 (ref <1.00)

## 2019-04-13 LAB — HEPATITIS B SURFACE ANTIBODY,QUALITATIVE: Hep B S Ab: NONREACTIVE

## 2019-04-13 LAB — HEPATITIS A ANTIBODY, TOTAL: Hepatitis A AB,Total: NONREACTIVE

## 2019-04-13 LAB — HEPATITIS B CORE ANTIBODY, TOTAL: Hep B Core Total Ab: NONREACTIVE

## 2019-04-13 LAB — HEPATITIS B SURFACE ANTIGEN: Hepatitis B Surface Ag: NONREACTIVE

## 2019-04-23 ENCOUNTER — Other Ambulatory Visit: Payer: Self-pay

## 2019-04-23 ENCOUNTER — Encounter: Payer: Self-pay | Admitting: Adult Health

## 2019-04-23 ENCOUNTER — Ambulatory Visit: Payer: Commercial Managed Care - PPO | Admitting: Adult Health

## 2019-04-23 VITALS — BP 122/90 | Temp 97.4°F | Wt 196.0 lb

## 2019-04-23 DIAGNOSIS — J302 Other seasonal allergic rhinitis: Secondary | ICD-10-CM | POA: Diagnosis not present

## 2019-04-23 NOTE — Progress Notes (Signed)
Subjective:    Patient ID: Joel Carawayracy Lee Utz Jr., male    DOB: Nov 15, 1985, 33 y.o.   MRN: [id number]  HPI 33 year old male who  has a past medical history of Chicken pox, Eosinophilia, HIV (human immunodeficiency virus infection) (HCC), Leukopenia, and Migraines.  He presents to the office today for two weeks of nasal congestion, right ear discomfort, itchy watery eyes, and productive cough. Cough is worse when he is laying down at night.   He denies fevers, chills, shortness of breath, chest pain, or feeling acutely ill.   Since the last time I saw him he was seen by ID for new diagnosis HIV. He was started on Biktarvy and Bactrim/Azithromycin for OI prophylaxis. Since starting treatment he is feeling " much better".   Review of Systems See HPI   Past Medical History:  Diagnosis Date  . Chicken pox   . Eosinophilia   . HIV (human immunodeficiency virus infection) (HCC)   . Leukopenia   . Migraines     Social History   Socioeconomic History  . Marital status: Married    Spouse name: Eliezer LoftsJessyca  . Number of children: 2  . Years of education: Not on file  . Highest education level: Not on file  Occupational History  . Not on file  Social Needs  . Financial resource strain: Not on file  . Food insecurity    Worry: Not on file    Inability: Not on file  . Transportation needs    Medical: Not on file    Non-medical: Not on file  Tobacco Use  . Smoking status: Never Smoker  . Smokeless tobacco: Never Used  Substance and Sexual Activity  . Alcohol use: Yes    Frequency: Never    Comment: occ  . Drug use: No  . Sexual activity: Yes  Lifestyle  . Physical activity    Days per week: Not on file    Minutes per session: Not on file  . Stress: Not on file  Relationships  . Social Musicianconnections    Talks on phone: Not on file    Gets together: Not on file    Attends religious service: Not on file    Active member of club or organization: Not on file    Attends meetings of  clubs or organizations: Not on file    Relationship status: Not on file  . Intimate partner violence    Fear of current or ex partner: Not on file    Emotionally abused: Not on file    Physically abused: Not on file    Forced sexual activity: Not on file  Other Topics Concern  . Not on file  Social History Narrative   [employer]ensing [employer]    Likes to watch football       Married    Two children 5&2       Past Surgical History:  Procedure Laterality Date  . TONSILLECTOMY  03/2017    Family History  Problem Relation Age of Onset  . Hypertension Mother   . Breast cancer Mother   . Diabetes Mellitus II Mother   . Hyperlipidemia Father        2013  . Heart attack Father   . Colon cancer Maternal Grandmother        Diagnosed in 1994   . Heart attack Paternal Grandmother   . Esophageal cancer Paternal Grandfather     Allergies  Allergen Reactions  . Other Hives  Oranges    Current Outpatient Medications on File Prior to Visit  Medication Sig Dispense Refill  . azithromycin (ZITHROMAX) 600 MG tablet Take 2 tablets (1,200 mg total) by mouth every 7 (seven) days. 10 tablet 5  . bictegravir-emtricitabine-tenofovir AF (BIKTARVY) 50-200-25 MG TABS tablet Take 1 tablet by mouth daily. 30 tablet 11  . folic acid (FOLVITE) 1 MG tablet Take 1 mg by mouth daily.    Marland Kitchen sulfamethoxazole-trimethoprim (BACTRIM) 400-80 MG tablet Take 1 tablet by mouth daily. 30 tablet 6   No current facility-administered medications on file prior to visit.     BP 122/90   Temp (!) 97.4 F (36.3 C)   Wt 196 lb (88.9 kg)   BMI 27.34 kg/m       Objective:   Physical Exam Vitals signs and nursing note reviewed.  Constitutional:      Appearance: Normal appearance. He is not ill-appearing.  HENT:     Head: Atraumatic.     Right Ear: Hearing, ear canal and external ear normal. There is no impacted cerumen. Tympanic membrane is bulging. Tympanic membrane is not erythematous.     Left Ear:  Hearing, ear canal and external ear normal. There is no impacted cerumen. Tympanic membrane is bulging. Tympanic membrane is not erythematous.     Nose: Rhinorrhea present. Rhinorrhea is clear.     Right Turbinates: Swollen.     Left Turbinates: Swollen.     Right Sinus: No maxillary sinus tenderness or frontal sinus tenderness.     Left Sinus: No maxillary sinus tenderness or frontal sinus tenderness.     Mouth/Throat:     Mouth: Mucous membranes are moist.     Pharynx: Oropharynx is clear.  Cardiovascular:     Rate and Rhythm: Normal rate and regular rhythm.     Pulses: Normal pulses.  Pulmonary:     Effort: Pulmonary effort is normal.     Breath sounds: Normal breath sounds. No stridor. No wheezing or rhonchi.  Skin:    General: Skin is warm and dry.     Capillary Refill: Capillary refill takes less than 2 seconds.  Neurological:     General: No focal deficit present.     Mental Status: He is alert and oriented to person, place, and time.  Psychiatric:        Mood and Affect: Mood normal.        Behavior: Behavior normal.        Thought Content: Thought content normal.       Assessment & Plan:  1. Seasonal allergies - Not concerned for otitis media or bacterial sinusitis.  - Advised Flonase  - Follow up if no improvement over the last week   Dorothyann Peng, NP

## 2019-04-26 MED FILL — SULFAMETHOXAZOLE-TMP SS TAB: 400-80 | 30 days supply | Qty: 30 | Fill #1

## 2019-04-26 MED FILL — BIKTARVY 50-200-25 MG TABS: 50-200-25 | 30 days supply | Qty: 30 | Fill #1

## 2019-04-26 MED FILL — AZITHROMYCIN 600 MG TABS: 600 | 28 days supply | Qty: 10 | Fill #1

## 2019-05-13 ENCOUNTER — Encounter: Payer: Self-pay | Admitting: Internal Medicine

## 2019-05-13 ENCOUNTER — Other Ambulatory Visit: Payer: Self-pay

## 2019-05-13 ENCOUNTER — Ambulatory Visit: Payer: Commercial Managed Care - PPO | Admitting: Internal Medicine

## 2019-05-13 VITALS — BP 131/80 | HR 72 | Temp 98.3°F | Wt 204.0 lb

## 2019-05-13 DIAGNOSIS — L819 Disorder of pigmentation, unspecified: Secondary | ICD-10-CM | POA: Diagnosis not present

## 2019-05-13 DIAGNOSIS — B20 Human immunodeficiency virus [HIV] disease: Secondary | ICD-10-CM | POA: Diagnosis not present

## 2019-05-13 DIAGNOSIS — Z23 Encounter for immunization: Secondary | ICD-10-CM | POA: Diagnosis not present

## 2019-05-13 NOTE — Progress Notes (Signed)
RFV: follow up for hiv disease  Patient ID: Joel Carawayracy Lee Goodley Jr., male   DOB: [birth year], 33 y.o.   MRN: [id number]  HPI Joel Wright is a 33yo M recently diagnosed with advanced hiv disease, CD 4 count <35/VL 490K. Started him on biktarvy plus oi proph. At last visit, concern for rash being ks but he went to derm for biopsy and found to be eczema  He is non-immune to hep a, or hep b  Outpatient Encounter Medications as of 05/13/2019  Medication Sig  . azithromycin (ZITHROMAX) 600 MG tablet Take 2 tablets (1,200 mg total) by mouth every 7 (seven) days.  . bictegravir-emtricitabine-tenofovir AF (BIKTARVY) 50-200-25 MG TABS tablet Take 1 tablet by mouth daily.  . folic acid (FOLVITE) 1 MG tablet Take 1 mg by mouth daily.  Marland Kitchen. sulfamethoxazole-trimethoprim (BACTRIM) 400-80 MG tablet Take 1 tablet by mouth daily.   No facility-administered encounter medications on file as of 05/13/2019.      Patient Active Problem List   Diagnosis Date Noted  . Neutropenic fever (HCC) 03/14/2019  . AKI (acute kidney injury) (HCC) 03/14/2019  . On methotrexate therapy 03/14/2019  . Thrombocytopenia (HCC) 03/14/2019  . Acute encephalopathy 03/14/2019   Social History   Tobacco Use  . Smoking status: Never Smoker  . Smokeless tobacco: Never Used  Substance Use Topics  . Alcohol use: Yes    Frequency: Never    Comment: occ  . Drug use: No     There are no preventive care reminders to display for this patient.   Review of Systems Review of Systems  Constitutional: Negative for fever, chills, diaphoresis, activity change, appetite change, fatigue and unexpected weight change.  HENT: Negative for congestion, sore throat, rhinorrhea, sneezing, trouble swallowing and sinus pressure.  Eyes: Negative for photophobia and visual disturbance.  Respiratory: Negative for cough, chest tightness, shortness of breath, wheezing and stridor.  Cardiovascular: Negative for chest pain, palpitations and leg swelling.   Gastrointestinal: Negative for nausea, vomiting, abdominal pain, diarrhea, constipation, blood in stool, abdominal distention and anal bleeding.  Genitourinary: Negative for dysuria, hematuria, flank pain and difficulty urinating.  Musculoskeletal: Negative for myalgias, back pain, joint swelling, arthralgias and gait problem.  Skin: Negative for color change, pallor, rash and wound.  Neurological: Negative for dizziness, tremors, weakness and light-headedness.  Hematological: Negative for adenopathy. Does not bruise/bleed easily.  Psychiatric/Behavioral: Negative for behavioral problems, confusion, sleep disturbance, dysphoric mood, decreased concentration and agitation.    Physical Exam   BP 131/80   Pulse 72   Temp 98.3 F (36.8 C) (Oral)   Wt 204 lb (92.5 kg)   BMI 28.45 kg/m  Physical Exam  Constitutional: He is oriented to person, place, and time. He appears well-developed and well-nourished. No distress.  HENT:  Mouth/Throat: Oropharynx is clear and moist. No oropharyngeal exudate.  Cardiovascular: Normal rate, regular rhythm and normal heart sounds. Exam reveals no gallop and no friction rub.  No murmur heard.  Pulmonary/Chest: Effort normal and breath sounds normal. No respiratory distress. He has no wheezes.  Abdominal: Soft. Bowel sounds are normal. He exhibits no distension. There is no tenderness.  Lymphadenopathy:  He has no cervical adenopathy.  Neurological: He is alert and oriented to person, place, and time.  Skin: + rash Psychiatric: He has a normal mood and affect. His behavior is normal.     Lab Results  Component Value Date   CD4TCELL 1 (L) 04/01/2019   Lab Results  Component Value Date   CD4TABS <  35 (L) 04/01/2019   Lab Results  Component Value Date   HIV1RNAQUANT 490,000 (H) 04/01/2019   Lab Results  Component Value Date   HEPBSAB NON-REACTIVE 04/01/2019   Lab Results  Component Value Date   LABRPR Non Reactive 03/14/2019    CBC Lab  Results  Component Value Date   WBC 1.2 (L) 04/01/2019   RBC 4.35 04/01/2019   HGB 11.9 (L) 04/01/2019   HCT 36.9 (L) 04/01/2019   PLT 182 04/01/2019   MCV 84.8 04/01/2019   MCH 27.4 04/01/2019   MCHC 32.2 04/01/2019   RDW 17.7 (H) 04/01/2019   LYMPHSABS 298 (L) 04/01/2019   MONOABS 0.3 03/20/2019   EOSABS 287 04/01/2019    BMET Lab Results  Component Value Date   NA 143 04/01/2019   K 4.2 04/01/2019   CL 107 04/01/2019   CO2 28 04/01/2019   GLUCOSE 106 (H) 04/01/2019   BUN 13 04/01/2019   CREATININE 0.81 04/01/2019   CALCIUM 10.7 (H) 04/01/2019   GFRNONAA 116 04/01/2019   GFRAA 134 04/01/2019      Assessment and Plan  hiv disease= will check viral load today to see if having sufficient decrease in viral load. Continue on biktarvy  oi proph= since cd 4 ct < 50, continue on bactrim, azithro, and fluconazole as needed for thrush  Health maintenance= will do hep series once cd 4ct above 200

## 2019-05-14 LAB — T-HELPER CELL (CD4) - (RCID CLINIC ONLY)
CD4 % Helper T Cell: 2 % — ABNORMAL LOW (ref 33–65)
CD4 T Cell Abs: <35 /uL — ABNORMAL LOW (ref 400–1790)

## 2019-05-17 LAB — HIV-1 RNA QUANT-NO REFLEX-BLD
HIV 1 RNA Quant: 66 copies/mL — ABNORMAL HIGH
HIV-1 RNA Quant, Log: 1.82 Log copies/mL — ABNORMAL HIGH

## 2019-05-27 MED FILL — BIKTARVY 50-200-25 MG TABS: 50-200-25 | 30 days supply | Qty: 30 | Fill #2

## 2019-05-27 MED FILL — SULFAMETHOXAZOLE-TMP SS TAB: 400-80 | 30 days supply | Qty: 30 | Fill #2

## 2019-05-27 MED FILL — AZITHROMYCIN 600 MG TABS: 600 | 28 days supply | Qty: 10 | Fill #2

## 2019-06-10 ENCOUNTER — Ambulatory Visit (INDEPENDENT_AMBULATORY_CARE_PROVIDER_SITE_OTHER): Payer: Commercial Managed Care - PPO | Admitting: Internal Medicine

## 2019-06-10 ENCOUNTER — Other Ambulatory Visit: Payer: Self-pay

## 2019-06-10 ENCOUNTER — Encounter: Payer: Self-pay | Admitting: Internal Medicine

## 2019-06-10 VITALS — BP 125/81 | HR 78 | Ht 71.0 in

## 2019-06-10 DIAGNOSIS — Z23 Encounter for immunization: Secondary | ICD-10-CM | POA: Diagnosis not present

## 2019-06-10 DIAGNOSIS — L819 Disorder of pigmentation, unspecified: Secondary | ICD-10-CM | POA: Diagnosis not present

## 2019-06-10 DIAGNOSIS — B20 Human immunodeficiency virus [HIV] disease: Secondary | ICD-10-CM

## 2019-06-10 NOTE — Progress Notes (Signed)
RFV: follow up for hiv disease  Patient ID: Joel Nora., Joel Wright   DOB: [birth year], 33 y.o.   MRN: [id number]  HPI 33yo M with hiv disease, CD 4 count <35/VL 66 on biktarvy ( only missed 1 doses), notices diarrhea with taking azithro only on 1 day. Continues to wear masks  Rash is improving with dermatology recommendations  Outpatient Encounter Medications as of 06/10/2019  Medication Sig  . azithromycin (ZITHROMAX) 600 MG tablet Take 2 tablets (1,200 mg total) by mouth every 7 (seven) days.  . bictegravir-emtricitabine-tenofovir AF (BIKTARVY) 50-200-25 MG TABS tablet Take 1 tablet by mouth daily.  . folic acid (FOLVITE) 1 MG tablet Take 1 mg by mouth daily.  Marland Kitchen sulfamethoxazole-trimethoprim (BACTRIM) 400-80 MG tablet Take 1 tablet by mouth daily.   No facility-administered encounter medications on file as of 06/10/2019.      Patient Active Problem List   Diagnosis Date Noted  . Neutropenic fever ([place]) 03/14/2019  . AKI (acute kidney injury) ([place]) 03/14/2019  . On methotrexate therapy 03/14/2019  . Thrombocytopenia ([place]) 03/14/2019  . Acute encephalopathy 03/14/2019     Health Maintenance Due  Topic Date Due  . INFLUENZA VACCINE  05/25/2019     Review of Systems Review of Systems  Constitutional: Negative for fever, chills, diaphoresis, activity change, appetite change, fatigue and unexpected weight change.  HENT: Negative for congestion, sore throat, rhinorrhea, sneezing, trouble swallowing and sinus pressure.  Eyes: Negative for photophobia and visual disturbance.  Respiratory: Negative for cough, chest tightness, shortness of breath, wheezing and stridor.  Cardiovascular: Negative for chest pain, palpitations and leg swelling.  Gastrointestinal: Negative for nausea, vomiting, abdominal pain, diarrhea, constipation, blood in stool, abdominal distention and anal bleeding.  Genitourinary: Negative for dysuria, hematuria, flank pain and difficulty urinating.   Musculoskeletal: Negative for myalgias, back pain, joint swelling, arthralgias and gait problem.  Skin: Negative for color change, pallor, rash and wound.  Neurological: Negative for dizziness, tremors, weakness and light-headedness.  Hematological: Negative for adenopathy. Does not bruise/bleed easily.  Psychiatric/Behavioral: Negative for behavioral problems, confusion, sleep disturbance, dysphoric mood, decreased concentration and agitation.   Social History   Tobacco Use  . Smoking status: Never Smoker  . Smokeless tobacco: Never Used  Substance Use Topics  . Alcohol use: Yes    Frequency: Never    Comment: occ  . Drug use: No    Physical Exam   BP 125/81   Pulse 78   Ht 5\' 11"  (1.803 m)   BMI 28.45 kg/m   Physical Exam  Constitutional: He is oriented to person, place, and time. He appears well-developed and well-nourished. No distress.  HENT:  Mouth/Throat: Oropharynx is clear and moist. No oropharyngeal exudate.  Cardiovascular: Normal rate, regular rhythm and normal heart sounds. Exam reveals no gallop and no friction rub.  No murmur heard.  Pulmonary/Chest: Effort normal and breath sounds normal. No respiratory distress. He has no wheezes.  Abdominal: Soft. Bowel sounds are normal. He exhibits no distension. There is no tenderness.  Lymphadenopathy:  He has no cervical adenopathy.  Neurological: He is alert and oriented to person, place, and time.  Skin: Skin is warm and dry. No rash noted. No erythema.  Psychiatric: He has a normal mood and affect. His behavior is normal.    Lab Results  Component Value Date   CD4TCELL 2 (L) 05/13/2019   Lab Results  Component Value Date   CD4TABS <35 (L) 05/13/2019   CD4TABS <35 (L) 04/01/2019   Lab  Results  Component Value Date   HIV1RNAQUANT 66 (H) 05/13/2019   Lab Results  Component Value Date   HEPBSAB NON-REACTIVE 04/01/2019   Lab Results  Component Value Date   LABRPR Non Reactive 03/14/2019    CBC Lab  Results  Component Value Date   WBC 1.2 (L) 04/01/2019   RBC 4.35 04/01/2019   HGB 11.9 (L) 04/01/2019   HCT 36.9 (L) 04/01/2019   PLT 182 04/01/2019   MCV 84.8 04/01/2019   MCH 27.4 04/01/2019   MCHC 32.2 04/01/2019   RDW 17.7 (H) 04/01/2019   LYMPHSABS 298 (L) 04/01/2019   MONOABS 0.3 03/20/2019   EOSABS 287 04/01/2019    BMET Lab Results  Component Value Date   NA 143 04/01/2019   K 4.2 04/01/2019   CL 107 04/01/2019   CO2 28 04/01/2019   GLUCOSE 106 (H) 04/01/2019   BUN 13 04/01/2019   CREATININE 0.81 04/01/2019   CALCIUM 10.7 (H) 04/01/2019   GFRNONAA 116 04/01/2019   GFRAA 134 04/01/2019      Assessment and Plan  hiv disease = do blood work to see that he has undetectable viral load. Continue with biktarvy  Low CD 4 count = continue with oi proph  Health promotion = Will give pneumo 23.

## 2019-06-10 NOTE — Progress Notes (Signed)
Verbal order per Dr. Baxter Flattery to administer pneumovax-23. Patient tolerated well. Eugenia Mcalpine

## 2019-06-11 LAB — T-HELPER CELL (CD4) - (RCID CLINIC ONLY)
CD4 % Helper T Cell: 2 % — ABNORMAL LOW (ref 33–65)
CD4 T Cell Abs: <35 /uL — ABNORMAL LOW (ref 400–1790)

## 2019-06-13 LAB — CBC WITH DIFFERENTIAL/PLATELET
Absolute Monocytes: 190 cells/uL — ABNORMAL LOW (ref 200–950)
Basophils Absolute: 31 cells/uL (ref 0–200)
Basophils Relative: 1.1 %
Eosinophils Absolute: 302 cells/uL (ref 15–500)
Eosinophils Relative: 10.8 %
HCT: 40.6 % (ref 38.5–50.0)
Hemoglobin: 13.4 g/dL (ref 13.2–17.1)
Lymphs Abs: 958 cells/uL (ref 850–3900)
MCH: 27.8 pg (ref 27.0–33.0)
MCHC: 33 g/dL (ref 32.0–36.0)
MCV: 84.2 fL (ref 80.0–100.0)
MPV: 9.5 fL (ref 7.5–12.5)
Monocytes Relative: 6.8 %
Neutro Abs: 1319 cells/uL — ABNORMAL LOW (ref 1500–7800)
Neutrophils Relative %: 47.1 %
Platelets: 182 10*3/uL (ref 140–400)
RBC: 4.82 10*6/uL (ref 4.20–5.80)
RDW: 15.7 % — ABNORMAL HIGH (ref 11.0–15.0)
Total Lymphocyte: 34.2 %
WBC: 2.8 10*3/uL — ABNORMAL LOW (ref 3.8–10.8)

## 2019-06-13 LAB — HIV-1 RNA QUANT-NO REFLEX-BLD
HIV 1 RNA Quant: <20 copies/mL — AB
HIV-1 RNA Quant, Log: <1.3 Log copies/mL — AB

## 2019-06-25 MED FILL — BIKTARVY 50-200-25 MG TABS: 50-200-25 | 30 days supply | Qty: 30 | Fill #3

## 2019-06-25 MED FILL — AZITHROMYCIN 600 MG TABS: 600 | 30 days supply | Qty: 10 | Fill #3

## 2019-06-25 MED FILL — SULFAMETHOXAZOLE-TMP SS TAB: 400-80 | 30 days supply | Qty: 30 | Fill #3

## 2019-07-09 ENCOUNTER — Encounter: Payer: Self-pay | Admitting: Adult Health

## 2019-07-11 ENCOUNTER — Encounter: Payer: Self-pay | Admitting: Adult Health

## 2019-07-26 MED FILL — SULFAMETHOXAZOLE-TMP SS TAB: 400-80 | 30 days supply | Qty: 30 | Fill #4

## 2019-07-26 MED FILL — BIKTARVY 50-200-25 MG TABS: 50-200-25 | 30 days supply | Qty: 30 | Fill #4

## 2019-07-26 MED FILL — AZITHROMYCIN 600 MG TABS: 600 | 30 days supply | Qty: 10 | Fill #4

## 2019-08-09 ENCOUNTER — Ambulatory Visit: Payer: Commercial Managed Care - PPO | Admitting: Podiatry

## 2019-08-15 ENCOUNTER — Ambulatory Visit (INDEPENDENT_AMBULATORY_CARE_PROVIDER_SITE_OTHER): Payer: Commercial Managed Care - PPO

## 2019-08-15 ENCOUNTER — Other Ambulatory Visit: Payer: Self-pay

## 2019-08-15 DIAGNOSIS — Z23 Encounter for immunization: Secondary | ICD-10-CM

## 2019-09-03 ENCOUNTER — Telehealth: Payer: Self-pay

## 2019-09-03 NOTE — Telephone Encounter (Signed)
COVID-19 Pre-Screening Questions:09/03/19   Do you currently have a fever (>100 F), chills or unexplained body aches? NO  Are you currently experiencing new cough, shortness of breath, sore throat, runny nose? NO .  Have you recently travelled outside the state of  in the last 14 days? NO  .  Have you been in contact with someone that is currently pending confirmation of Covid19 testing or has been confirmed to have the Covid19 virus?  NO   **If the patient answers NO to ALL questions -  advise the patient to please call the clinic before coming to the office should any symptoms develop.     

## 2019-09-04 ENCOUNTER — Encounter: Payer: Self-pay | Admitting: Internal Medicine

## 2019-09-04 ENCOUNTER — Ambulatory Visit: Payer: Commercial Managed Care - PPO | Admitting: Internal Medicine

## 2019-09-04 ENCOUNTER — Other Ambulatory Visit: Payer: Self-pay

## 2019-09-04 VITALS — Wt 223.8 lb

## 2019-09-04 DIAGNOSIS — B20 Human immunodeficiency virus [HIV] disease: Secondary | ICD-10-CM | POA: Diagnosis not present

## 2019-09-04 DIAGNOSIS — Z79899 Other long term (current) drug therapy: Secondary | ICD-10-CM

## 2019-09-04 MED ORDER — ONDANSETRON HCL 4 MG PO TABS: 4.0000 mg | ORAL_TABLET | Freq: Three times a day (TID) | ORAL | 0 refills | Status: DC | PRN

## 2019-09-04 MED FILL — BIKTARVY 50-200-25 MG TABS: 50-200-25 | 30 days supply | Qty: 30 | Fill #5

## 2019-09-04 MED FILL — SULFAMETHOXAZOLE-TMP SS TAB: 400-80 | 30 days supply | Qty: 30 | Fill #5

## 2019-09-04 MED FILL — AZITHROMYCIN 600 MG TABS: 600 | 30 days supply | Qty: 10 | Fill #5

## 2019-09-04 MED FILL — ONDANSETRON HCL 4 MG TABLET: 4 | 7 days supply | Qty: 20 | Fill #0

## 2019-09-04 NOTE — Progress Notes (Signed)
RFV: follow up for hiv disease  Patient ID: Joel Wright., male   DOB: 1985/12/06, 33 y.o.   MRN: [id number]  HPI Joel Wright is 33yo F with doing well with taking biktarvy without difficulty., continues on oi proph.   Outpatient Encounter Medications as of 09/04/2019  Medication Sig  . bictegravir-emtricitabine-tenofovir AF (BIKTARVY) 50-200-25 MG TABS tablet Take 1 tablet by mouth daily.  Marland Kitchen azithromycin (ZITHROMAX) 600 MG tablet Take 2 tablets (1,200 mg total) by mouth every 7 (seven) days.  . folic acid (FOLVITE) 1 MG tablet Take 1 mg by mouth daily.  Marland Kitchen sulfamethoxazole-trimethoprim (BACTRIM) 400-80 MG tablet Take 1 tablet by mouth daily.   No facility-administered encounter medications on file as of 09/04/2019.      Patient Active Problem List   Diagnosis Date Noted  . Neutropenic fever (HCC) 03/14/2019  . AKI (acute kidney injury) (HCC) 03/14/2019  . On methotrexate therapy 03/14/2019  . Thrombocytopenia (HCC) 03/14/2019  . Acute encephalopathy 03/14/2019   Social History   Tobacco Use  . Smoking status: Never Smoker  . Smokeless tobacco: Never Used  Substance Use Topics  . Alcohol use: Yes    Frequency: Never    Comment: occ  . Drug use: No    There are no preventive care reminders to display for this patient.   Review of Systems Review of Systems  Constitutional: Negative for fever, chills, diaphoresis, activity change, appetite change, fatigue and unexpected weight change.  HENT: Negative for congestion, sore throat, rhinorrhea, sneezing, trouble swallowing and sinus pressure.  Eyes: Negative for photophobia and visual disturbance.  Respiratory: Negative for cough, chest tightness, shortness of breath, wheezing and stridor.  Cardiovascular: Negative for chest pain, palpitations and leg swelling.  Gastrointestinal: Negative for nausea, vomiting, abdominal pain, diarrhea, constipation, blood in stool, abdominal distention and anal bleeding.  Genitourinary:  Negative for dysuria, hematuria, flank pain and difficulty urinating.  Musculoskeletal: Negative for myalgias, back pain, joint swelling, arthralgias and gait problem.  Skin: Negative for color change, pallor, rash and wound.  Neurological: Negative for dizziness, tremors, weakness and light-headedness.  Hematological: Negative for adenopathy. Does not bruise/bleed easily.  Psychiatric/Behavioral: Negative for behavioral problems, confusion, sleep disturbance, dysphoric mood, decreased concentration and agitation.    Physical Exam   Wt 223 lb 12.8 oz (101.5 kg)   BMI 31.21 kg/m   Physical Exam  Constitutional: He is oriented to person, place, and time. He appears well-developed and well-nourished. No distress.  HENT:  Mouth/Throat: Oropharynx is clear and moist. No oropharyngeal exudate.  Cardiovascular: Normal rate, regular rhythm and normal heart sounds. Exam reveals no gallop and no friction rub.  No murmur heard.  Pulmonary/Chest: Effort normal and breath sounds normal. No respiratory distress. He has no wheezes.  Abdominal: Soft. Bowel sounds are normal. He exhibits no distension. There is no tenderness.  Lymphadenopathy:  He has no cervical adenopathy.  Neurological: He is alert and oriented to person, place, and time.  Skin: Skin is warm and dry. No rash noted. No erythema.  Psychiatric: He has a normal mood and affect. His behavior is normal.    Lab Results  Component Value Date   CD4TCELL 2 (L) 06/10/2019   Lab Results  Component Value Date   CD4TABS <35 (L) 06/10/2019   CD4TABS <35 (L) 05/13/2019   CD4TABS <35 (L) 04/01/2019   Lab Results  Component Value Date   HIV1RNAQUANT <20 DETECTED (A) 06/10/2019   Lab Results  Component Value Date   HEPBSAB NON-REACTIVE 04/01/2019  Lab Results  Component Value Date   LABRPR Non Reactive 03/14/2019    CBC Lab Results  Component Value Date   WBC 2.8 (L) 06/10/2019   RBC 4.82 06/10/2019   HGB 13.4 06/10/2019    HCT 40.6 06/10/2019   PLT 182 06/10/2019   MCV 84.2 06/10/2019   MCH 27.8 06/10/2019   MCHC 33.0 06/10/2019   RDW 15.7 (H) 06/10/2019   LYMPHSABS 958 06/10/2019   MONOABS 0.3 03/20/2019   EOSABS 302 06/10/2019    BMET Lab Results  Component Value Date   NA 143 04/01/2019   K 4.2 04/01/2019   CL 107 04/01/2019   CO2 28 04/01/2019   GLUCOSE 106 (H) 04/01/2019   BUN 13 04/01/2019   CREATININE 0.81 04/01/2019   CALCIUM 10.7 (H) 04/01/2019   GFRNONAA 116 04/01/2019   GFRAA 134 04/01/2019      Assessment and Plan hiv disease =will continue on biktarvy. Will check labs  Long term medication = cr is stable  oi proph = if CD 4 count >50, can stop azithromycin this coming month. Continue on bactrim

## 2019-09-04 NOTE — Progress Notes (Signed)
HPI: Joel Wright. is a 33 y.o. male who presents to the [place] [facility] clinic for HIV follow-up.  Patient Active Problem List   Diagnosis Date Noted  . Neutropenic fever ([place]) 03/14/2019  . AKI (acute kidney injury) ([place]) 03/14/2019  . On methotrexate therapy 03/14/2019  . Thrombocytopenia ([place]) 03/14/2019  . Acute encephalopathy 03/14/2019    Patient's Medications  New Prescriptions   ONDANSETRON (ZOFRAN) 4 MG TABLET    Take 1 tablet (4 mg total) by mouth every 8 (eight) hours as needed for nausea or vomiting.  Previous Medications   AZITHROMYCIN (ZITHROMAX) 600 MG TABLET    Take 2 tablets (1,200 mg total) by mouth every 7 (seven) days.   BICTEGRAVIR-EMTRICITABINE-TENOFOVIR AF (BIKTARVY) 50-200-25 MG TABS TABLET    Take 1 tablet by mouth daily.   FOLIC ACID (FOLVITE) 1 MG TABLET    Take 1 mg by mouth daily.   SULFAMETHOXAZOLE-TRIMETHOPRIM (BACTRIM) 400-80 MG TABLET    Take 1 tablet by mouth daily.  Modified Medications   No medications on file  Discontinued Medications   No medications on file    Allergies: Allergies  Allergen Reactions  . Other Hives    Oranges    Past Medical History: Past Medical History:  Diagnosis Date  . Chicken pox   . Eosinophilia   . HIV (human immunodeficiency virus infection) ([place])   . Leukopenia   . Migraines     Social History: Social History   Socioeconomic History  . Marital status: Married    Spouse name: Lorne Skeens  . Number of children: 2  . Years of education: Not on file  . Highest education level: Not on file  Occupational History  . Not on file  Social Needs  . Financial resource strain: Not on file  . Food insecurity    Worry: Not on file    Inability: Not on file  . Transportation needs    Medical: Not on file    Non-medical: Not on file  Tobacco Use  . Smoking status: Never Smoker  . Smokeless tobacco: Never Used  Substance and Sexual Activity  . Alcohol use: Yes    Frequency: Never    Comment: occ   . Drug use: No  . Sexual activity: Yes    Partners: Female    Comment: declined condoms 08/2019  Lifestyle  . Physical activity    Days per week: Not on file    Minutes per session: Not on file  . Stress: Not on file  Relationships  . Social Herbalist on phone: Not on file    Gets together: Not on file    Attends religious service: Not on file    Active member of club or organization: Not on file    Attends meetings of clubs or organizations: Not on file    Relationship status: Not on file  Other Topics Concern  . 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       Married    Two children 5&2       Labs: Lab Results  Component Value Date   HIV1RNAQUANT <20 DETECTED (A) 06/10/2019   HIV1RNAQUANT 66 (H) 05/13/2019   HIV1RNAQUANT 490,000 (H) 04/01/2019   CD4TABS <35 (L) 06/10/2019   CD4TABS <35 (L) 05/13/2019   CD4TABS <35 (L) 04/01/2019    RPR and STI Lab Results  Component Value Date  LABRPR Non Reactive 03/14/2019    STI Results GC CT  04/01/2019 Negative Negative    Hepatitis B Lab Results  Component Value Date   HEPBSAB NON-REACTIVE 04/01/2019   HEPBSAG NON-REACTIVE 04/01/2019   HEPBCAB NON-REACTIVE 04/01/2019   Hepatitis C Lab Results  Component Value Date   HEPCAB NON-REACTIVE 04/01/2019   Hepatitis A Lab Results  Component Value Date   HAV NON-REACTIVE 04/01/2019   Lipids: No results found for: CHOL, TRIG, HDL, CHOLHDL, VLDL, LDLCALC  Current HIV Regimen: Biktarvy  Assessment: Joel Wright is here to see Dr. Baxter Flattery for a follow up HIV appointment. He was started on Biktarvy in June and has been tolerating it well. His viral load was undetectable in August. He reports 1 missed dose this past month, but he has an alarm on his phone to help ensure he does not miss doses. His only complaint is regarding the azithromycin he is taking for MAC prophylaxis. He takes this medication once weekly with  food but it does not help his nausea. Spoke with him about why he needs to be on this medication and that we typically like to see his CD4 count >100 for at least three months before stopping. Also mentioned that perhaps an anti-emetic such as zofran may be useful for him and he will discuss with Dr. Baxter Flattery. He tolerates the bactrim with no issues. All his questions were answered.   Plan: - Continue Biktarvy, azithromycin and bactrim - Consider anti-emetic for nausea with azithromycin - HIV viral load, CD4, CBC w/diff, CMET  Nicoletta Dress, PharmD PGY2 Infectious Disease Pharmacy Resident  [facility] 09/04/2019, 11:39 AM

## 2019-09-05 LAB — T-HELPER CELL (CD4) - (RCID CLINIC ONLY)
CD4 % Helper T Cell: 6 % — ABNORMAL LOW (ref 33–65)
CD4 T Cell Abs: 62 /uL — ABNORMAL LOW (ref 400–1790)

## 2019-09-24 LAB — CBC WITH DIFFERENTIAL/PLATELET
Absolute Monocytes: 250 cells/uL (ref 200–950)
Basophils Absolute: 21 cells/uL (ref 0–200)
Basophils Relative: 0.5 %
Eosinophils Absolute: 373 cells/uL (ref 15–500)
Eosinophils Relative: 9.1 %
HCT: 42.9 % (ref 38.5–50.0)
Hemoglobin: 14.4 g/dL (ref 13.2–17.1)
Lymphs Abs: 1099 cells/uL (ref 850–3900)
MCH: 27 pg (ref 27.0–33.0)
MCHC: 33.6 g/dL (ref 32.0–36.0)
MCV: 80.3 fL (ref 80.0–100.0)
MPV: 9.5 fL (ref 7.5–12.5)
Monocytes Relative: 6.1 %
Neutro Abs: 2358 cells/uL (ref 1500–7800)
Neutrophils Relative %: 57.5 %
Platelets: 181 10*3/uL (ref 140–400)
RBC: 5.34 10*6/uL (ref 4.20–5.80)
RDW: 15.2 % — ABNORMAL HIGH (ref 11.0–15.0)
Total Lymphocyte: 26.8 %
WBC: 4.1 10*3/uL (ref 3.8–10.8)

## 2019-09-24 LAB — COMPLETE METABOLIC PANEL WITH GFR
AG Ratio: 1.9 (calc) (ref 1.0–2.5)
ALT: 30 U/L (ref 9–46)
AST: 30 U/L (ref 10–40)
Albumin: 4.9 g/dL (ref 3.6–5.1)
Alkaline phosphatase (APISO): 41 U/L (ref 36–130)
BUN: 19 mg/dL (ref 7–25)
CO2: 25 mmol/L (ref 20–32)
Calcium: 10.7 mg/dL — ABNORMAL HIGH (ref 8.6–10.3)
Chloride: 102 mmol/L (ref 98–110)
Creat: 1.11 mg/dL (ref 0.60–1.35)
GFR, Est African American: 99 mL/min/{1.73_m2} (ref >=60)
GFR, Est Non African American: 86 mL/min/{1.73_m2} (ref >=60)
Globulin: 2.6 g/dL (calc) (ref 1.9–3.7)
Glucose, Bld: 89 mg/dL (ref 65–99)
Potassium: 4 mmol/L (ref 3.5–5.3)
Sodium: 136 mmol/L (ref 135–146)
Total Bilirubin: 1.2 mg/dL (ref 0.2–1.2)
Total Protein: 7.5 g/dL (ref 6.1–8.1)

## 2019-09-24 LAB — HIV-1 RNA QUANT-NO REFLEX-BLD
HIV 1 RNA Quant: <20 copies/mL — AB
HIV-1 RNA Quant, Log: <1.3 Log copies/mL — AB

## 2019-10-07 MED FILL — BIKTARVY 50-200-25 MG TABS: 50-200-25 | 30 days supply | Qty: 30 | Fill #6

## 2019-10-07 MED FILL — SULFAMETHOXAZOLE-TMP SS TAB: 400-80 | 30 days supply | Qty: 30 | Fill #6

## 2019-10-29 ENCOUNTER — Other Ambulatory Visit: Payer: Self-pay | Admitting: Internal Medicine

## 2019-10-29 DIAGNOSIS — Z21 Asymptomatic human immunodeficiency virus [HIV] infection status: Secondary | ICD-10-CM

## 2019-10-29 MED FILL — SULFAMETHOXAZOLE-TMP SS TAB: 400-80 | 30 days supply | Qty: 30 | Fill #0

## 2019-11-04 MED FILL — BIKTARVY 50-200-25 MG TABS: 50-200-25 | 30 days supply | Qty: 30 | Fill #7

## 2019-12-02 ENCOUNTER — Ambulatory Visit: Payer: Commercial Managed Care - PPO | Admitting: Internal Medicine

## 2019-12-04 ENCOUNTER — Other Ambulatory Visit: Payer: Self-pay

## 2019-12-04 ENCOUNTER — Ambulatory Visit: Payer: Commercial Managed Care - PPO | Admitting: Internal Medicine

## 2019-12-04 DIAGNOSIS — B20 Human immunodeficiency virus [HIV] disease: Secondary | ICD-10-CM

## 2019-12-04 DIAGNOSIS — L308 Other specified dermatitis: Secondary | ICD-10-CM

## 2019-12-04 DIAGNOSIS — Z79899 Other long term (current) drug therapy: Secondary | ICD-10-CM

## 2019-12-04 MED FILL — BIKTARVY 50-200-25 MG TABS: 50-200-25 | 30 days supply | Qty: 30 | Fill #8

## 2019-12-04 MED FILL — SULFAMETHOXAZOLE-TMP SS TAB: 400-80 | 30 days supply | Qty: 30 | Fill #1

## 2019-12-04 NOTE — Progress Notes (Signed)
RFV: follow up for hiv disease  Patient ID: Joel Nora., male   DOB: Jul 20, 1986, 34 y.o.   MRN: [id number]  HPI Joel Wright is a 34yo M with advanced hiv disease, CD 4 count of 62/VL<20 in nov 2020, on biktarvy, with oi proph of bactrim and azithromycin. He reports that he has been taking his meds and doing well overall.  Outpatient Encounter Medications as of 12/04/2019  Medication Sig  . azithromycin (ZITHROMAX) 600 MG tablet Take 2 tablets (1,200 mg total) by mouth every 7 (seven) days.  . bictegravir-emtricitabine-tenofovir AF (BIKTARVY) 50-200-25 MG TABS tablet Take 1 tablet by mouth daily.  . folic acid (FOLVITE) 1 MG tablet Take 1 mg by mouth daily.  . ondansetron (ZOFRAN) 4 MG tablet Take 1 tablet (4 mg total) by mouth every 8 (eight) hours as needed for nausea or vomiting.  . sulfamethoxazole-trimethoprim (BACTRIM) 400-80 MG tablet TAKE 1 TABLET BY MOUTH DAILY.   No facility-administered encounter medications on file as of 12/04/2019.     Patient Active Problem List   Diagnosis Date Noted  . Neutropenic fever ([place]) 03/14/2019  . AKI (acute kidney injury) ([place]) 03/14/2019  . On methotrexate therapy 03/14/2019  . Thrombocytopenia ([place]) 03/14/2019  . Acute encephalopathy 03/14/2019   Social History   Tobacco Use  . Smoking status: Never Smoker  . Smokeless tobacco: Never Used  Substance Use Topics  . Alcohol use: Yes    Comment: occ  . Drug use: No    There are no preventive care reminders to display for this patient.   Review of Systems Review of Systems  Constitutional: Negative for fever, chills, diaphoresis, activity change, appetite change, fatigue and unexpected weight change.  HENT: Negative for congestion, sore throat, rhinorrhea, sneezing, trouble swallowing and sinus pressure.  Eyes: Negative for photophobia and visual disturbance.  Respiratory: Negative for cough, chest tightness, shortness of breath, wheezing and stridor.  Cardiovascular: Negative  for chest pain, palpitations and leg swelling.  Gastrointestinal: Negative for nausea, vomiting, abdominal pain, diarrhea, constipation, blood in stool, abdominal distention and anal bleeding.  Genitourinary: Negative for dysuria, hematuria, flank pain and difficulty urinating.  Musculoskeletal: Negative for myalgias, back pain, joint swelling, arthralgias and gait problem.  Skin: Negative for color change, pallor, rash and wound.  Neurological: Negative for dizziness, tremors, weakness and light-headedness.  Hematological: Negative for adenopathy. Does not bruise/bleed easily.  Psychiatric/Behavioral: Negative for behavioral problems, confusion, sleep disturbance, dysphoric mood, decreased concentration and agitation.    Physical Exam   There were no vitals taken for this visit.  Physical Exam  Constitutional: He is oriented to person, place, and time. He appears well-developed and well-nourished. No distress.  HENT:  Mouth/Throat: Oropharynx is clear and moist. No oropharyngeal exudate.  Cardiovascular: Normal rate, regular rhythm and normal heart sounds. Exam reveals no gallop and no friction rub.  No murmur heard.  Pulmonary/Chest: Effort normal and breath sounds normal. No respiratory distress. He has no wheezes.  Abdominal: Soft. Bowel sounds are normal. He exhibits no distension. There is no tenderness.  Lymphadenopathy:  He has no cervical adenopathy.  Neurological: He is alert and oriented to person, place, and time.  Skin: Skin is warm and dry. No rash noted. No erythema.  Psychiatric: He has a normal mood and affect. His behavior is normal.    Lab Results  Component Value Date   CD4TCELL 6 (L) 09/04/2019   Lab Results  Component Value Date   CD4TABS 62 (L) 09/04/2019   CD4TABS <  35 (L) 06/10/2019   CD4TABS <35 (L) 05/13/2019   Lab Results  Component Value Date   HIV1RNAQUANT <20 DETECTED (A) 09/04/2019   Lab Results  Component Value Date   HEPBSAB NON-REACTIVE  04/01/2019   Lab Results  Component Value Date   LABRPR Non Reactive 03/14/2019    CBC Lab Results  Component Value Date   WBC 4.1 09/04/2019   RBC 5.34 09/04/2019   HGB 14.4 09/04/2019   HCT 42.9 09/04/2019   PLT 181 09/04/2019   MCV 80.3 09/04/2019   MCH 27.0 09/04/2019   MCHC 33.6 09/04/2019   RDW 15.2 (H) 09/04/2019   LYMPHSABS 1,099 09/04/2019   MONOABS 0.3 03/20/2019   EOSABS 373 09/04/2019    BMET Lab Results  Component Value Date   NA 136 09/04/2019   K 4.0 09/04/2019   CL 102 09/04/2019   CO2 25 09/04/2019   GLUCOSE 89 09/04/2019   BUN 19 09/04/2019   CREATININE 1.11 09/04/2019   CALCIUM 10.7 (H) 09/04/2019   GFRNONAA 86 09/04/2019   GFRAA 99 09/04/2019    Assessment and Plan  hiv disease= well controlled. Continue on biktarvy. Has slow improvement with increase in cd 4 count. Will repeat labs today  oi proph = can stop taking azithromycin but would continue on bactrim daily  Long term medication management = cr stable  Rash = eczema. Unchanged.

## 2019-12-05 LAB — T-HELPER CELL (CD4) - (RCID CLINIC ONLY)
CD4 % Helper T Cell: 10 % — ABNORMAL LOW (ref 33–65)
CD4 T Cell Abs: 114 /uL — ABNORMAL LOW (ref 400–1790)

## 2019-12-08 LAB — CBC WITH DIFFERENTIAL/PLATELET
Absolute Monocytes: 184 cells/uL — ABNORMAL LOW (ref 200–950)
Basophils Absolute: 31 cells/uL (ref 0–200)
Basophils Relative: 0.9 %
Eosinophils Absolute: 272 cells/uL (ref 15–500)
Eosinophils Relative: 8 %
HCT: 41.1 % (ref 38.5–50.0)
Hemoglobin: 13.6 g/dL (ref 13.2–17.1)
Lymphs Abs: 1173 cells/uL (ref 850–3900)
MCH: 26.8 pg — ABNORMAL LOW (ref 27.0–33.0)
MCHC: 33.1 g/dL (ref 32.0–36.0)
MCV: 81.1 fL (ref 80.0–100.0)
MPV: 9.5 fL (ref 7.5–12.5)
Monocytes Relative: 5.4 %
Neutro Abs: 1741 cells/uL (ref 1500–7800)
Neutrophils Relative %: 51.2 %
Platelets: 205 10*3/uL (ref 140–400)
RBC: 5.07 10*6/uL (ref 4.20–5.80)
RDW: 15.7 % — ABNORMAL HIGH (ref 11.0–15.0)
Total Lymphocyte: 34.5 %
WBC: 3.4 10*3/uL — ABNORMAL LOW (ref 3.8–10.8)

## 2019-12-08 LAB — COMPLETE METABOLIC PANEL WITH GFR
AG Ratio: 2 (calc) (ref 1.0–2.5)
ALT: 23 U/L (ref 9–46)
AST: 32 U/L (ref 10–40)
Albumin: 4.9 g/dL (ref 3.6–5.1)
Alkaline phosphatase (APISO): 48 U/L (ref 36–130)
BUN: 13 mg/dL (ref 7–25)
CO2: 27 mmol/L (ref 20–32)
Calcium: 10.4 mg/dL — ABNORMAL HIGH (ref 8.6–10.3)
Chloride: 105 mmol/L (ref 98–110)
Creat: 1.04 mg/dL (ref 0.60–1.35)
GFR, Est African American: 107 mL/min/{1.73_m2} (ref >=60)
GFR, Est Non African American: 93 mL/min/{1.73_m2} (ref >=60)
Globulin: 2.4 g/dL (calc) (ref 1.9–3.7)
Glucose, Bld: 96 mg/dL (ref 65–99)
Potassium: 4.3 mmol/L (ref 3.5–5.3)
Sodium: 140 mmol/L (ref 135–146)
Total Bilirubin: 0.7 mg/dL (ref 0.2–1.2)
Total Protein: 7.3 g/dL (ref 6.1–8.1)

## 2019-12-08 LAB — GLUCOSE 6 PHOSPHATE DEHYDROGENASE: G-6PDH: 2.8 U/g Hgb — ABNORMAL LOW (ref 7.0–20.5)

## 2019-12-08 LAB — RPR: RPR Ser Ql: NONREACTIVE

## 2019-12-08 LAB — HIV-1 RNA QUANT-NO REFLEX-BLD
HIV 1 RNA Quant: <20 copies/mL
HIV-1 RNA Quant, Log: <1.3 Log copies/mL

## 2020-01-02 MED FILL — BIKTARVY 50-200-25 MG TABS: 50-200-25 | 30 days supply | Qty: 30 | Fill #9

## 2020-01-02 MED FILL — SULFAMETHOXAZOLE-TMP SS TAB: 400-80 | 30 days supply | Qty: 30 | Fill #2

## 2020-02-06 MED FILL — BIKTARVY 50-200-25 MG TABS: 50-200-25 | 30 days supply | Qty: 30 | Fill #10

## 2020-02-06 MED FILL — SULFAMETHOXAZOLE-TMP SS TAB: 400-80 | 30 days supply | Qty: 30 | Fill #3

## 2020-03-04 ENCOUNTER — Encounter: Payer: Self-pay | Admitting: Internal Medicine

## 2020-03-04 ENCOUNTER — Telehealth: Payer: Self-pay | Admitting: Pharmacist

## 2020-03-04 ENCOUNTER — Ambulatory Visit: Payer: Commercial Managed Care - PPO | Admitting: Internal Medicine

## 2020-03-04 ENCOUNTER — Other Ambulatory Visit: Payer: Self-pay

## 2020-03-04 VITALS — BP 127/76 | HR 72 | Wt 235.0 lb

## 2020-03-04 DIAGNOSIS — Z79899 Other long term (current) drug therapy: Secondary | ICD-10-CM | POA: Diagnosis not present

## 2020-03-04 DIAGNOSIS — B20 Human immunodeficiency virus [HIV] disease: Secondary | ICD-10-CM

## 2020-03-04 DIAGNOSIS — L308 Other specified dermatitis: Secondary | ICD-10-CM

## 2020-03-04 NOTE — Progress Notes (Signed)
RFV: follow up for hiv disease  Patient ID: Joel Wright., male   DOB: January 15, 1986, 34 y.o.   MRN: [id number]  HPI 34yo M hiv disease, diagnosed 1 year ago with advanced hiv disease. Has been taking biktarvy and bactrim for oi proph. Has not missed dose of biktarvy. No other health complaints Received covid vaccine -fully vaccinated for 4 wk now. @ [employer]   Social hx: Has 61 yo and 48 yo. son Outpatient Encounter Medications as of 03/04/2020  Medication Sig  . bictegravir-emtricitabine-tenofovir AF (BIKTARVY) 50-200-25 MG TABS tablet Take 1 tablet by mouth daily.  Marland Kitchen sulfamethoxazole-trimethoprim (BACTRIM) 400-80 MG tablet TAKE 1 TABLET BY MOUTH DAILY.  Marland Kitchen azithromycin (ZITHROMAX) 600 MG tablet Take 2 tablets (1,200 mg total) by mouth every 7 (seven) days. (Patient not taking: Reported on 03/04/2020)  . folic acid (FOLVITE) 1 MG tablet Take 1 mg by mouth daily.  . ondansetron (ZOFRAN) 4 MG tablet Take 1 tablet (4 mg total) by mouth every 8 (eight) hours as needed for nausea or vomiting.   No facility-administered encounter medications on file as of 03/04/2020.     Patient Active Problem List   Diagnosis Date Noted  . Neutropenic fever ([facility]) 03/14/2019  . AKI (acute kidney injury) ([facility]) 03/14/2019  . On methotrexate therapy 03/14/2019  . Thrombocytopenia ([facility]) 03/14/2019  . Acute encephalopathy 03/14/2019     Health Maintenance Due  Topic Date Due  . COVID-19 Vaccine (1) Never done     Review of Systems 12 point ros is negative, except what is mentioned in hpi Physical Exam   BP 127/76   Pulse 72   Wt 235 lb (106.6 kg)   BMI 32.78 kg/m   Physical Exam  Constitutional: He is oriented to person, place, and time. He appears well-developed and well-nourished. No distress.  HENT:  Mouth/Throat: Oropharynx is clear and moist. No oropharyngeal exudate.  Cardiovascular: Normal rate, regular rhythm and normal heart sounds. Exam reveals no gallop and no friction rub.  No  murmur heard.  Pulmonary/Chest: Effort normal and breath sounds normal. No respiratory distress. He has no wheezes.  Abdominal: Soft. Bowel sounds are normal. He exhibits no distension. There is no tenderness.  Lymphadenopathy:  He has no cervical adenopathy.  Neurological: He is alert and oriented to person, place, and time.  Skin: Skin is warm and dry. No rash noted. No erythema.  Psychiatric: He has a normal mood and affect. His behavior is normal.    Lab Results  Component Value Date   CD4TCELL 10 (L) 12/04/2019   Lab Results  Component Value Date   CD4TABS 114 (L) 12/04/2019   CD4TABS 62 (L) 09/04/2019   CD4TABS <35 (L) 06/10/2019   Lab Results  Component Value Date   HIV1RNAQUANT <20 NOT DETECTED 12/04/2019   Lab Results  Component Value Date   HEPBSAB NON-REACTIVE 04/01/2019   Lab Results  Component Value Date   LABRPR NON-REACTIVE 12/04/2019    CBC Lab Results  Component Value Date   WBC 3.4 (L) 12/04/2019   RBC 5.07 12/04/2019   HGB 13.6 12/04/2019   HCT 41.1 12/04/2019   PLT 205 12/04/2019   MCV 81.1 12/04/2019   MCH 26.8 (L) 12/04/2019   MCHC 33.1 12/04/2019   RDW 15.7 (H) 12/04/2019   LYMPHSABS 1,173 12/04/2019   MONOABS 0.3 03/20/2019   EOSABS 272 12/04/2019    BMET Lab Results  Component Value Date   NA 140 12/04/2019   K 4.3 12/04/2019  CL 105 12/04/2019   CO2 27 12/04/2019   GLUCOSE 96 12/04/2019   BUN 13 12/04/2019   CREATININE 1.04 12/04/2019   CALCIUM 10.4 (H) 12/04/2019   GFRNONAA 93 12/04/2019   GFRAA 107 12/04/2019     Assessment and Plan  hiv disease = continue on biktarvy and bactrim (since CD 4 count <200) will check CD 4 count today to see if can stop taking bactrim for oi proph. Interested in clinical trials. Interested in injection if insurance covers  Long term medication management = cr is stable  Eczema = hands peeling at times/intermittently. Continue with using lotion

## 2020-03-04 NOTE — Telephone Encounter (Signed)
Faxed patient's [place] enrollment form to [facility] today. They will assess patient's insurance and send a benefits investigation form detailing how to start the injections (where to get it from and cost). Once benefits investigation is complete, we will order oral lead-in therapy and start patient on treatment. Will update encounter with further details when available. 

## 2020-03-05 LAB — T-HELPER CELL (CD4) - (RCID CLINIC ONLY)
CD4 % Helper T Cell: 13 % — ABNORMAL LOW (ref 33–65)
CD4 T Cell Abs: 158 /uL — ABNORMAL LOW (ref 400–1790)

## 2020-03-09 MED FILL — SULFAMETHOXAZOLE-TMP SS TAB: 400-80 | 30 days supply | Qty: 30 | Fill #4

## 2020-03-09 MED FILL — BIKTARVY 50-200-25 MG TABS: 50-200-25 | 30 days supply | Qty: 30 | Fill #11

## 2020-04-03 ENCOUNTER — Other Ambulatory Visit: Payer: Self-pay | Admitting: Internal Medicine

## 2020-04-03 DIAGNOSIS — Z21 Asymptomatic human immunodeficiency virus [HIV] infection status: Secondary | ICD-10-CM

## 2020-04-08 MED FILL — SULFAMETHOXAZOLE-TMP SS TAB: 400-80 | 30 days supply | Qty: 30 | Fill #5

## 2020-04-08 MED FILL — BIKTARVY 50-200-25 MG TABS: 50-200-25 | 30 days supply | Qty: 30 | Fill #0

## 2020-04-20 MED FILL — SULFAMETHOXAZOLE-TMP SS TAB: 400-80 | 30 days supply | Qty: 30 | Fill #5

## 2020-04-20 MED FILL — BIKTARVY 50-200-25 MG TABS: 50-200-25 | 30 days supply | Qty: 30 | Fill #0

## 2020-05-18 MED FILL — SULFAMETHOXAZOLE-TMP SS TAB: 400-80 | 30 days supply | Qty: 30 | Fill #6

## 2020-05-18 MED FILL — BIKTARVY 50-200-25 MG TABS: 50-200-25 | 30 days supply | Qty: 30 | Fill #1

## 2020-06-12 ENCOUNTER — Other Ambulatory Visit: Payer: Self-pay | Admitting: Internal Medicine

## 2020-06-12 DIAGNOSIS — Z21 Asymptomatic human immunodeficiency virus [HIV] infection status: Secondary | ICD-10-CM

## 2020-06-12 MED FILL — SULFAMETHOXAZOLE-TMP SS TAB: 400-80 | 30 days supply | Qty: 30 | Fill #0

## 2020-06-16 MED FILL — BIKTARVY 50-200-25 MG TABS: 50-200-25 | 30 days supply | Qty: 30 | Fill #2

## 2020-06-26 ENCOUNTER — Other Ambulatory Visit: Payer: Self-pay | Admitting: Adult Health

## 2020-06-26 ENCOUNTER — Encounter: Payer: Self-pay | Admitting: Adult Health

## 2020-06-26 MED ORDER — VALACYCLOVIR HCL 1 G PO TABS: 1000.0000 mg | ORAL_TABLET | Freq: Three times a day (TID) | ORAL | 0 refills | Status: AC

## 2020-07-06 ENCOUNTER — Encounter: Payer: Self-pay | Admitting: Internal Medicine

## 2020-07-06 ENCOUNTER — Other Ambulatory Visit: Payer: Self-pay

## 2020-07-06 ENCOUNTER — Ambulatory Visit: Payer: Commercial Managed Care - PPO | Admitting: Internal Medicine

## 2020-07-06 VITALS — BP 132/84 | HR 64 | Temp 98.3°F | Ht 71.0 in | Wt 231.0 lb

## 2020-07-06 DIAGNOSIS — Z23 Encounter for immunization: Secondary | ICD-10-CM | POA: Diagnosis not present

## 2020-07-06 DIAGNOSIS — Z79899 Other long term (current) drug therapy: Secondary | ICD-10-CM

## 2020-07-06 DIAGNOSIS — B029 Zoster without complications: Secondary | ICD-10-CM | POA: Diagnosis not present

## 2020-07-06 DIAGNOSIS — B20 Human immunodeficiency virus [HIV] disease: Secondary | ICD-10-CM | POA: Diagnosis not present

## 2020-07-06 NOTE — Progress Notes (Signed)
RFV: follow up for hiv disease  Patient ID: Joel Wright., male   DOB: 09/02/1986, 34 y.o.   MRN: [id number]  HPI 34yo M with hiv disease, CD 4 count 158/VL<20 on biktarvy. He reports last week that he had new onset of chills  the night before shingles outbreak that started on left flank one dermatome -started on valtrex for 10 days ( almost done) , scabbed over now. Overall has been great with adherence. No other covid exposure.  Outpatient Encounter Medications as of 07/06/2020  Medication Sig  . BIKTARVY 50-200-25 MG TABS tablet TAKE 1 TABLET BY MOUTH DAILY.  Marland Kitchen sulfamethoxazole-trimethoprim (BACTRIM) 400-80 MG tablet TAKE 1 TABLET BY MOUTH DAILY.  . valACYclovir (VALTREX) 1000 MG tablet Take 1 tablet (1,000 mg total) by mouth 3 (three) times daily for 10 days.  . ondansetron (ZOFRAN) 4 MG tablet Take 1 tablet (4 mg total) by mouth every 8 (eight) hours as needed for nausea or vomiting. (Patient not taking: Reported on 07/06/2020)   No facility-administered encounter medications on file as of 07/06/2020.     Patient Active Problem List   Diagnosis Date Noted  . Neutropenic fever ([facility]) 03/14/2019  . AKI (acute kidney injury) ([facility]) 03/14/2019  . On methotrexate therapy 03/14/2019  . Thrombocytopenia ([facility]) 03/14/2019  . Acute encephalopathy 03/14/2019     Health Maintenance Due  Topic Date Due  . COVID-19 Vaccine (1) Never done  . INFLUENZA VACCINE  05/24/2020     Review of Systems Review of Systems  Constitutional: Negative for fever, chills, diaphoresis, activity change, appetite change, fatigue and unexpected weight change.  HENT: Negative for congestion, sore throat, rhinorrhea, sneezing, trouble swallowing and sinus pressure.  Eyes: Negative for photophobia and visual disturbance.  Respiratory: Negative for cough, chest tightness, shortness of breath, wheezing and stridor.  Cardiovascular: Negative for chest pain, palpitations and leg swelling.  Gastrointestinal:  Negative for nausea, vomiting, abdominal pain, diarrhea, constipation, blood in stool, abdominal distention and anal bleeding.  Genitourinary: Negative for dysuria, hematuria, flank pain and difficulty urinating.  Musculoskeletal: Negative for myalgias, back pain, joint swelling, arthralgias and gait problem.  Skin: Negative for color change, pallor, rash and wound.  Neurological: Negative for dizziness, tremors, weakness and light-headedness.  Hematological: Negative for adenopathy. Does not bruise/bleed easily.  Psychiatric/Behavioral: Negative for behavioral problems, confusion, sleep disturbance, dysphoric mood, decreased concentration and agitation.    Physical Exam   BP 132/84   Pulse 64   Temp 98.3 F (36.8 C) (Oral)   Ht 5\' 11"  (1.803 m)   Wt 231 lb (104.8 kg)   SpO2 97%   BMI 32.22 kg/m   gen = a xo by 3 in nad HEENT =  No signs of thrush pulm = CTAB no w/c/r Cors = nl s1,s2, no g/m/r Skin = Pinpoint scabs on left flank Lab Results  Component Value Date   CD4TCELL 13 (L) 03/04/2020   Lab Results  Component Value Date   CD4TABS 158 (L) 03/04/2020   CD4TABS 114 (L) 12/04/2019   CD4TABS 62 (L) 09/04/2019   Lab Results  Component Value Date   HIV1RNAQUANT <20 NOT DETECTED 12/04/2019   Lab Results  Component Value Date   HEPBSAB NON-REACTIVE 04/01/2019   Lab Results  Component Value Date   LABRPR NON-REACTIVE 12/04/2019    CBC Lab Results  Component Value Date   WBC 3.4 (L) 12/04/2019   RBC 5.07 12/04/2019   HGB 13.6 12/04/2019   HCT 41.1 12/04/2019   PLT  205 12/04/2019   MCV 81.1 12/04/2019   MCH 26.8 (L) 12/04/2019   MCHC 33.1 12/04/2019   RDW 15.7 (H) 12/04/2019   LYMPHSABS 1,173 12/04/2019   MONOABS 0.3 03/20/2019   EOSABS 272 12/04/2019    BMET Lab Results  Component Value Date   NA 140 12/04/2019   K 4.3 12/04/2019   CL 105 12/04/2019   CO2 27 12/04/2019   GLUCOSE 96 12/04/2019   BUN 13 12/04/2019   CREATININE 1.04 12/04/2019    CALCIUM 10.4 (H) 12/04/2019   GFRNONAA 93 12/04/2019   GFRAA 107 12/04/2019      Assessment and Plan  hiv disease = continue on biktarvy, will check labs  oi proph = out of bactrim since last week. Will check cd 4 count since may not need  Shingles = finish out valtrex  Health maintenance = will get booster vaccine for covid this week. Will give flu shot today  Long term medication = check cr today

## 2020-07-07 ENCOUNTER — Encounter: Payer: Self-pay | Admitting: Adult Health

## 2020-07-07 LAB — T-HELPER CELL (CD4) - (RCID CLINIC ONLY)
CD4 % Helper T Cell: 14 % — ABNORMAL LOW (ref 33–65)
CD4 T Cell Abs: 136 /uL — ABNORMAL LOW (ref 400–1790)

## 2020-07-08 LAB — CBC WITH DIFFERENTIAL/PLATELET
Absolute Monocytes: 161 cells/uL — ABNORMAL LOW (ref 200–950)
Basophils Absolute: 21 cells/uL (ref 0–200)
Basophils Relative: 0.6 %
Eosinophils Absolute: 70 cells/uL (ref 15–500)
Eosinophils Relative: 2 %
HCT: 43 % (ref 38.5–50.0)
Hemoglobin: 14.3 g/dL (ref 13.2–17.1)
Lymphs Abs: 1092 cells/uL (ref 850–3900)
MCH: 27.3 pg (ref 27.0–33.0)
MCHC: 33.3 g/dL (ref 32.0–36.0)
MCV: 82.1 fL (ref 80.0–100.0)
MPV: 9.7 fL (ref 7.5–12.5)
Monocytes Relative: 4.6 %
Neutro Abs: 2156 cells/uL (ref 1500–7800)
Neutrophils Relative %: 61.6 %
Platelets: 214 10*3/uL (ref 140–400)
RBC: 5.24 10*6/uL (ref 4.20–5.80)
RDW: 15.1 % — ABNORMAL HIGH (ref 11.0–15.0)
Total Lymphocyte: 31.2 %
WBC: 3.5 10*3/uL — ABNORMAL LOW (ref 3.8–10.8)

## 2020-07-08 LAB — RPR: RPR Ser Ql: NONREACTIVE

## 2020-07-08 LAB — COMPLETE METABOLIC PANEL WITH GFR
AG Ratio: 2.1 (calc) (ref 1.0–2.5)
ALT: 14 U/L (ref 9–46)
AST: 21 U/L (ref 10–40)
Albumin: 5 g/dL (ref 3.6–5.1)
Alkaline phosphatase (APISO): 44 U/L (ref 36–130)
BUN: 10 mg/dL (ref 7–25)
CO2: 27 mmol/L (ref 20–32)
Calcium: 10.3 mg/dL (ref 8.6–10.3)
Chloride: 105 mmol/L (ref 98–110)
Creat: 1.14 mg/dL (ref 0.60–1.35)
GFR, Est African American: 95 mL/min/{1.73_m2} (ref >=60)
GFR, Est Non African American: 82 mL/min/{1.73_m2} (ref >=60)
Globulin: 2.4 g/dL (calc) (ref 1.9–3.7)
Glucose, Bld: 110 mg/dL — ABNORMAL HIGH (ref 65–99)
Potassium: 4.2 mmol/L (ref 3.5–5.3)
Sodium: 140 mmol/L (ref 135–146)
Total Bilirubin: 0.8 mg/dL (ref 0.2–1.2)
Total Protein: 7.4 g/dL (ref 6.1–8.1)

## 2020-07-08 LAB — HIV-1 RNA QUANT-NO REFLEX-BLD
HIV 1 RNA Quant: <20 Copies/mL
HIV-1 RNA Quant, Log: <1.3 Log cps/mL

## 2020-07-08 NOTE — Telephone Encounter (Signed)
Spoke with patient regarding recent blood work. All questions/concerns answered. No further concerns voiced. Patient thanked LPN for taking his call. Valarie Cones

## 2020-07-16 MED FILL — BIKTARVY 50-200-25 MG TABS: 50-200-25 | 30 days supply | Qty: 30 | Fill #3

## 2020-08-12 ENCOUNTER — Telehealth: Payer: Self-pay

## 2020-08-12 NOTE — Telephone Encounter (Signed)
My Chart [facility] Message  

## 2020-08-17 MED FILL — BIKTARVY 50-200-25 MG TABS: 50-200-25 | 30 days supply | Qty: 30 | Fill #4

## 2020-09-10 ENCOUNTER — Telehealth: Payer: Self-pay

## 2020-09-10 MED FILL — BIKTARVY 50-200-25 MG TABS: 50-200-25 | 30 days supply | Qty: 30 | Fill #5

## 2020-09-10 NOTE — Telephone Encounter (Signed)
[facility] Patient Advocate Encounter   Received notification from [facility] that prior authorization for BIKTARVY is required.   PA submitted on 09/10/20 Key [id number] Status is pending    [facility] will continue to follow.   Clearance Coots, CPhT Specialty Pharmacy Patient [facility] for Infect[facility] Disease Phone: [phone] Fax:  [phone]

## 2020-10-12 ENCOUNTER — Other Ambulatory Visit: Payer: Self-pay | Admitting: Internal Medicine

## 2020-10-12 DIAGNOSIS — Z21 Asymptomatic human immunodeficiency virus [HIV] infection status: Secondary | ICD-10-CM

## 2020-10-13 MED FILL — BIKTARVY 50-200-25 MG TABS: 50-200-25 | 30 days supply | Qty: 30 | Fill #0

## 2020-11-02 ENCOUNTER — Ambulatory Visit: Payer: Commercial Managed Care - PPO | Admitting: Internal Medicine

## 2020-11-04 ENCOUNTER — Other Ambulatory Visit: Payer: Self-pay | Admitting: Internal Medicine

## 2020-11-04 DIAGNOSIS — Z21 Asymptomatic human immunodeficiency virus [HIV] infection status: Secondary | ICD-10-CM

## 2020-11-05 MED FILL — BIKTARVY 50-200-25 MG TABS: 50-200-25 | 30 days supply | Qty: 30 | Fill #0

## 2020-11-23 ENCOUNTER — Encounter: Payer: Self-pay | Admitting: Internal Medicine

## 2020-11-23 ENCOUNTER — Other Ambulatory Visit: Payer: Self-pay

## 2020-11-23 ENCOUNTER — Ambulatory Visit (INDEPENDENT_AMBULATORY_CARE_PROVIDER_SITE_OTHER): Payer: Commercial Managed Care - PPO | Admitting: Internal Medicine

## 2020-11-23 VITALS — BP 141/83 | HR 76 | Temp 97.4°F | Ht 71.0 in | Wt 236.0 lb

## 2020-11-23 DIAGNOSIS — R030 Elevated blood-pressure reading, without diagnosis of hypertension: Secondary | ICD-10-CM | POA: Diagnosis not present

## 2020-11-23 DIAGNOSIS — Z79899 Other long term (current) drug therapy: Secondary | ICD-10-CM

## 2020-11-23 DIAGNOSIS — B20 Human immunodeficiency virus [HIV] disease: Secondary | ICD-10-CM

## 2020-11-23 NOTE — Progress Notes (Signed)
RFV: follow up for hiv disease  Patient ID: Cari Caraway., male   DOB: 12/25/1985, 35 y.o.   MRN: [id number]  HPI 35yo M with well controlled hiv disease, CD 4 count of 136/VL<20. Started on biktarvy with CD 4 coutn<35. He has not had any opportunistic infections. Has   Not had covid. Doing traveling for work. He is doing well with his overall health. No UC or ED visits. He continues to do Individual counseling, started 2 months ago. He is having marital issues, thus trying counseling  Has had 3 doses of pfizer:  1st dose/[id number]  on 01/02/20 2nd dose/[id number] on 01/23/20 3rd dose/[id number] on 09/02/20    Outpatient Encounter Medications as of 11/23/2020  Medication Sig  . BIKTARVY 50-200-25 MG TABS tablet TAKE 1 TABLET BY MOUTH DAILY.  Marland Kitchen ondansetron (ZOFRAN) 4 MG tablet Take 1 tablet (4 mg total) by mouth every 8 (eight) hours as needed for nausea or vomiting. (Patient not taking: No sig reported)  . sulfamethoxazole-trimethoprim (BACTRIM) 400-80 MG tablet TAKE 1 TABLET BY MOUTH DAILY. (Patient not taking: Reported on 11/23/2020)   No facility-administered encounter medications on file as of 11/23/2020.     Patient Active Problem List   Diagnosis Date Noted  . Neutropenic fever ([facility]) 03/14/2019  . AKI (acute kidney injury) ([facility]) 03/14/2019  . On methotrexate therapy 03/14/2019  . Thrombocytopenia ([facility]) 03/14/2019  . Acute encephalopathy 03/14/2019   Soc hx: no smoking Review of Systems  Constitutional: Negative for fever, chills, diaphoresis, activity change, appetite change, fatigue and unexpected weight change.  HENT: Negative for congestion, sore throat, rhinorrhea, sneezing, trouble swallowing and sinus pressure.  Eyes: Negative for photophobia and visual disturbance.  Respiratory: Negative for cough, chest tightness, shortness of breath, wheezing and stridor.  Cardiovascular: Negative for chest pain, palpitations and leg swelling.  Gastrointestinal: Negative for nausea,  vomiting, abdominal pain, diarrhea, constipation, blood in stool, abdominal distention and anal bleeding.  Genitourinary: Negative for dysuria, hematuria, flank pain and difficulty urinating.  Musculoskeletal: Negative for myalgias, back pain, joint swelling, arthralgias and gait problem.  Skin: Negative for color change, pallor, rash and wound.  Neurological: Negative for dizziness, tremors, weakness and light-headedness.  Hematological: Negative for adenopathy. Does not bruise/bleed easily.  Psychiatric/Behavioral: Negative for behavioral problems, confusion, sleep disturbance, dysphoric mood, decreased concentration and agitation.    Physical Exam   BP (!) 141/83   Pulse 76   Temp (!) 97.4 F (36.3 C) (Oral)   Ht 5\' 11"  (1.803 m)   Wt 236 lb (107 kg)   SpO2 100%   BMI 32.92 kg/m   Physical Exam  Constitutional: He is oriented to person, place, and time. He appears well-developed and well-nourished. No distress.  HENT:  Mouth/Throat: Oropharynx is clear and moist. No oropharyngeal exudate.  Lymphadenopathy:  He has no cervical adenopathy.  Neurological: He is alert and oriented to person, place, and time.  Skin: Skin is warm and dry. No rash noted. No erythema.  Psychiatric: He has a normal mood and affect. His behavior is normal.    Lab Results  Component Value Date   CD4TCELL 14 (L) 07/06/2020   Lab Results  Component Value Date   CD4TABS 136 (L) 07/06/2020   CD4TABS 158 (L) 03/04/2020   CD4TABS 114 (L) 12/04/2019   Lab Results  Component Value Date   HIV1RNAQUANT <20 07/06/2020   Lab Results  Component Value Date   HEPBSAB NON-REACTIVE 04/01/2019   Lab Results  Component Value Date  LABRPR NON-REACTIVE 07/06/2020    CBC Lab Results  Component Value Date   WBC 3.5 (L) 07/06/2020   RBC 5.24 07/06/2020   HGB 14.3 07/06/2020   HCT 43.0 07/06/2020   PLT 214 07/06/2020   MCV 82.1 07/06/2020   MCH 27.3 07/06/2020   MCHC 33.3 07/06/2020   RDW 15.1 (H)  07/06/2020   LYMPHSABS 1,092 07/06/2020   MONOABS 0.3 03/20/2019   EOSABS 70 07/06/2020    BMET Lab Results  Component Value Date   NA 140 07/06/2020   K 4.2 07/06/2020   CL 105 07/06/2020   CO2 27 07/06/2020   GLUCOSE 110 (H) 07/06/2020   BUN 10 07/06/2020   CREATININE 1.14 07/06/2020   CALCIUM 10.3 07/06/2020   GFRNONAA 82 07/06/2020   GFRAA 95 07/06/2020   Assessment and Plan  hiv disease = will check CD 4 count and VL. If CD 4 count <200, will need to restart bactrim. His CD 4 count is 189 (17%). Still remains undetectable. Will plan to continue with biktarvy. Likely need 3-6 months of bactrim  Long term medication management = will check cr  Health maintenance = uptodate on vaccines  Prehypertension= will ask him to decrease salt intake, check BP as he is at home or in community to see if truly persistently elevated

## 2020-11-24 LAB — T-HELPER CELL (CD4) - (RCID CLINIC ONLY)
CD4 % Helper T Cell: 17 % — ABNORMAL LOW (ref 33–65)
CD4 T Cell Abs: 189 /uL — ABNORMAL LOW (ref 400–1790)

## 2020-11-26 LAB — COMPLETE METABOLIC PANEL WITH GFR
AG Ratio: 1.8 (calc) (ref 1.0–2.5)
ALT: 20 U/L (ref 9–46)
AST: 24 U/L (ref 10–40)
Albumin: 4.7 g/dL (ref 3.6–5.1)
Alkaline phosphatase (APISO): 49 U/L (ref 36–130)
BUN: 10 mg/dL (ref 7–25)
CO2: 26 mmol/L (ref 20–32)
Calcium: 10.2 mg/dL (ref 8.6–10.3)
Chloride: 105 mmol/L (ref 98–110)
Creat: 0.98 mg/dL (ref 0.60–1.35)
GFR, Est African American: 114 mL/min/{1.73_m2} (ref >=60)
GFR, Est Non African American: 99 mL/min/{1.73_m2} (ref >=60)
Globulin: 2.6 g/dL (calc) (ref 1.9–3.7)
Glucose, Bld: 127 mg/dL — ABNORMAL HIGH (ref 65–99)
Potassium: 4 mmol/L (ref 3.5–5.3)
Sodium: 140 mmol/L (ref 135–146)
Total Bilirubin: 1 mg/dL (ref 0.2–1.2)
Total Protein: 7.3 g/dL (ref 6.1–8.1)

## 2020-11-26 LAB — CBC WITH DIFFERENTIAL/PLATELET
Absolute Monocytes: 173 cells/uL — ABNORMAL LOW (ref 200–950)
Basophils Absolute: 29 cells/uL (ref 0–200)
Basophils Relative: 0.8 %
Eosinophils Absolute: 252 cells/uL (ref 15–500)
Eosinophils Relative: 7 %
HCT: 43.2 % (ref 38.5–50.0)
Hemoglobin: 14.7 g/dL (ref 13.2–17.1)
Lymphs Abs: 1141 cells/uL (ref 850–3900)
MCH: 27.7 pg (ref 27.0–33.0)
MCHC: 34 g/dL (ref 32.0–36.0)
MCV: 81.5 fL (ref 80.0–100.0)
MPV: 9.9 fL (ref 7.5–12.5)
Monocytes Relative: 4.8 %
Neutro Abs: 2005 cells/uL (ref 1500–7800)
Neutrophils Relative %: 55.7 %
Platelets: 204 10*3/uL (ref 140–400)
RBC: 5.3 10*6/uL (ref 4.20–5.80)
RDW: 14.3 % (ref 11.0–15.0)
Total Lymphocyte: 31.7 %
WBC: 3.6 10*3/uL — ABNORMAL LOW (ref 3.8–10.8)

## 2020-11-26 LAB — RPR: RPR Ser Ql: NONREACTIVE

## 2020-11-26 LAB — HIV-1 RNA QUANT-NO REFLEX-BLD
HIV 1 RNA Quant: <20 Copies/mL
HIV-1 RNA Quant, Log: <1.3 Log cps/mL

## 2020-12-07 ENCOUNTER — Other Ambulatory Visit: Payer: Self-pay | Admitting: Internal Medicine

## 2020-12-07 DIAGNOSIS — Z21 Asymptomatic human immunodeficiency virus [HIV] infection status: Secondary | ICD-10-CM

## 2020-12-10 MED FILL — BIKTARVY 50-200-25 MG TABS: 50-200-25 | 30 days supply | Qty: 30 | Fill #0

## 2020-12-30 NOTE — Telephone Encounter (Signed)
I spoke to [place] and set up his last fill at [facility] for 03/10 for [employer] to ship on 03/11 at his address here in [place]. Patient will look for a Clinic out in [place] when he get there.

## 2020-12-30 NOTE — Telephone Encounter (Signed)
If they live in [place] I will not be able to get them [facility] Advancing Access. I would like to say someone here can recommend a clinic to them in they area which that clinic would be able to help them get there medication.

## 2020-12-31 MED FILL — BIKTARVY 50-200-25 MG TABS: 50-200-25 | 30 days supply | Qty: 30 | Fill #1

## 2021-01-15 ENCOUNTER — Other Ambulatory Visit (HOSPITAL_COMMUNITY): Payer: Self-pay

## 2021-01-29 ENCOUNTER — Other Ambulatory Visit (HOSPITAL_COMMUNITY): Payer: Self-pay

## 2021-02-01 ENCOUNTER — Other Ambulatory Visit (HOSPITAL_COMMUNITY): Payer: Self-pay

## 2021-03-15 ENCOUNTER — Ambulatory Visit: Payer: Self-pay | Admitting: Internal Medicine

## 2021-03-31 IMAGING — DX PORTABLE CHEST - 1 VIEW
1 series · 1 of 1 positions shown · non-contrast
Comparison: 01/29/2018

CLINICAL DATA: Fevers and headaches

EXAM:
PORTABLE CHEST 1 VIEW

[chest ap]
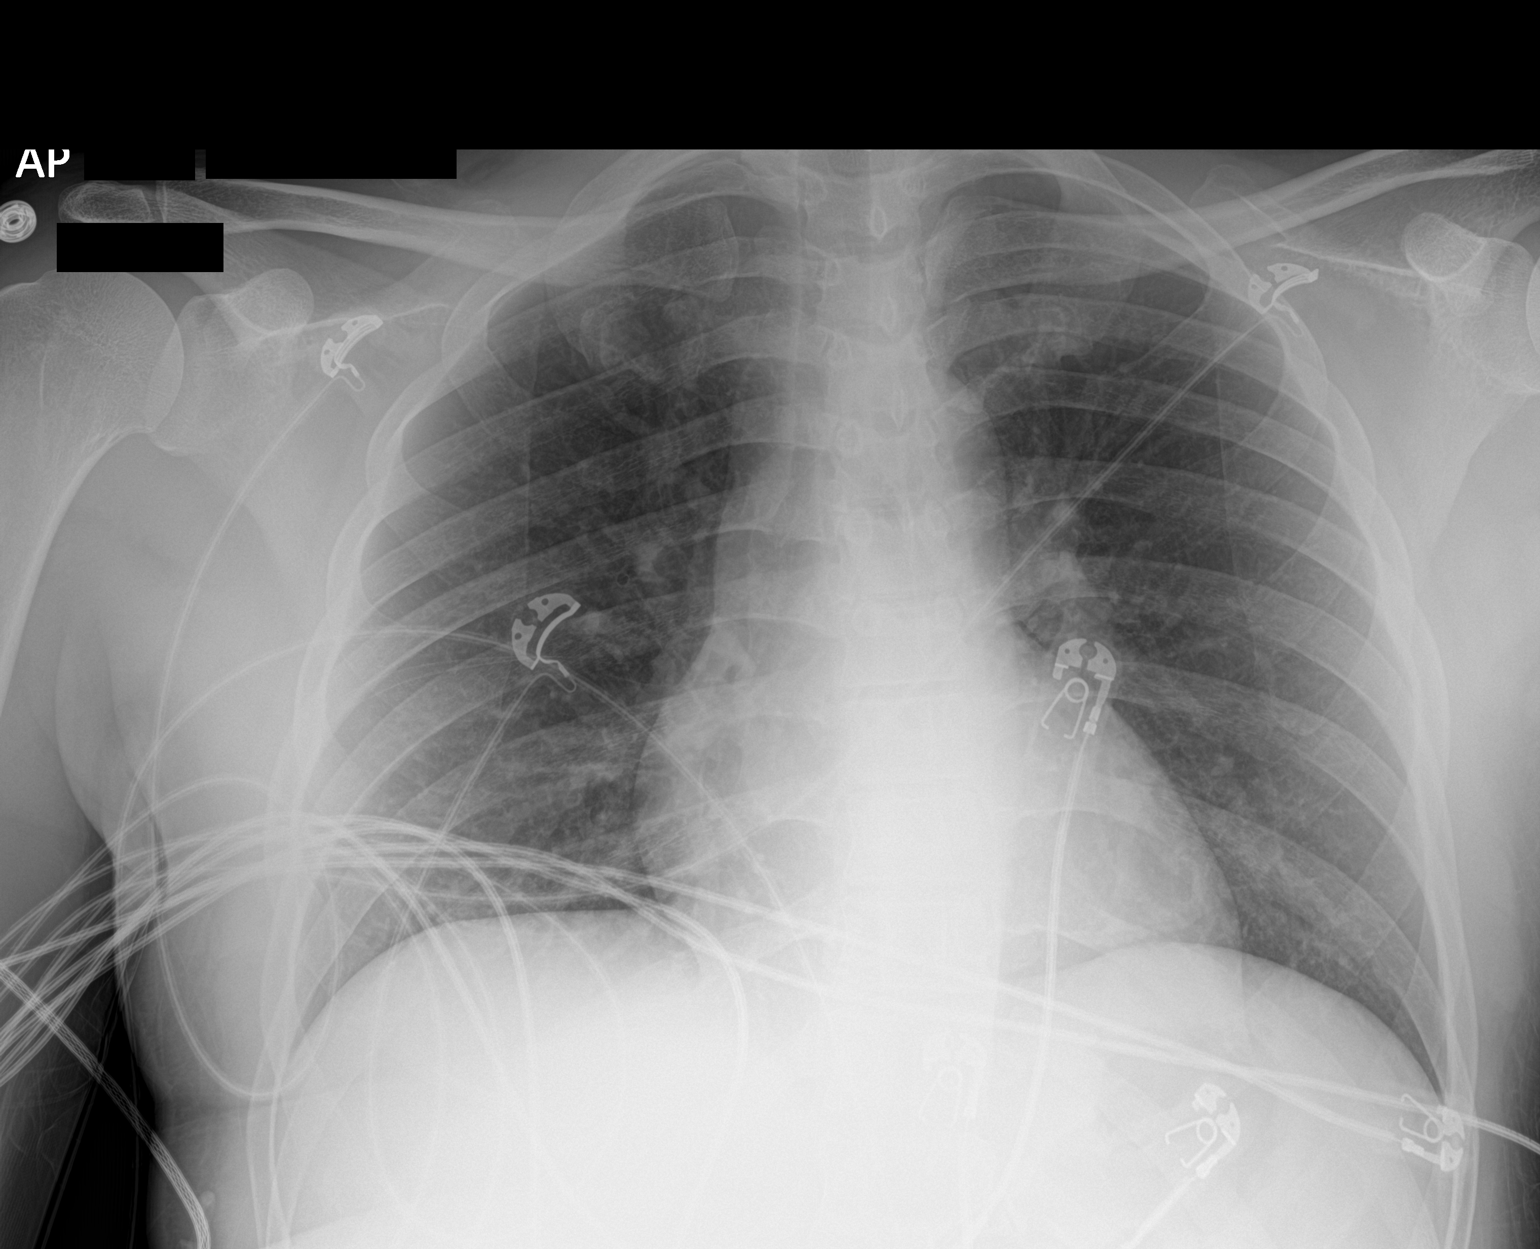

[1 of 1 positions shown; findings below may reference images not displayed]

FINDINGS: The heart size and mediastinal contours are within normal limits.
Both lungs are clear. The visualized skeletal structures are
unremarkable.
IMPRESSION: No active disease.
